# Patient Record
Sex: Female | Born: 1940 | Race: White | Hispanic: No | Marital: Married
Health system: Midwestern US, Academic
[De-identification: ages and names within clinical notes are randomized; demographics above are authoritative.]

---

## 2014-08-14 IMAGING — MG MAMMOGRAM, DIGITAL SCREEN BILA
4 series · 8 of 8 positions shown · non-contrast
Comparison: [DATE] [DATE], [DATE], [DATE] [DATE], [DATE].

MAMMOGRAM BILATERAL
INDICATION: Screening
Benign right biopsy times two.
TECHNIQUE: Four standard projections with CAD.

[Series 1: R CC · right · 2 of 2 slices shown]
[im 1/2]
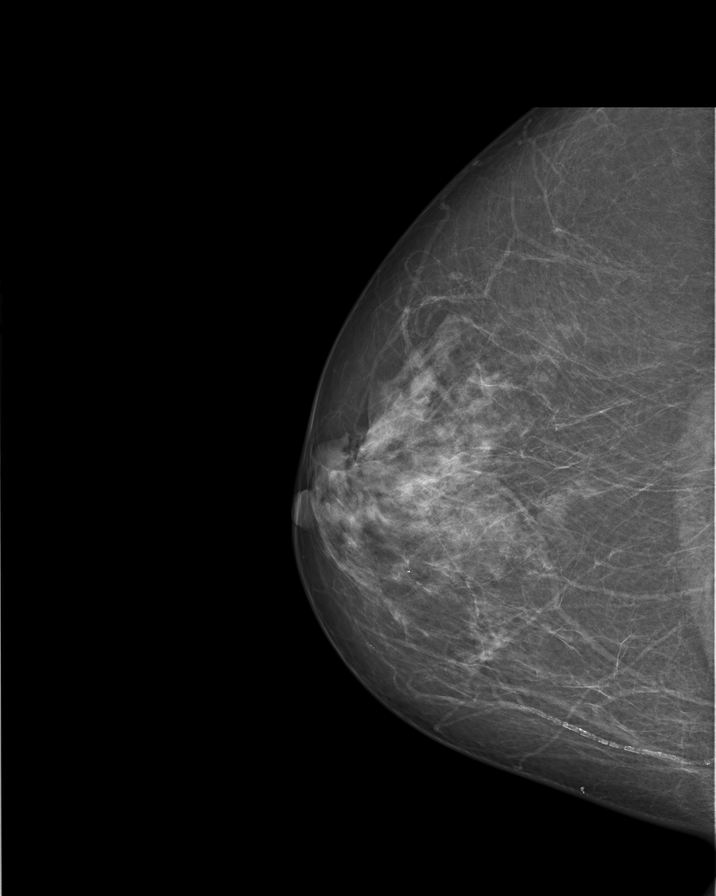
[im 2/2]
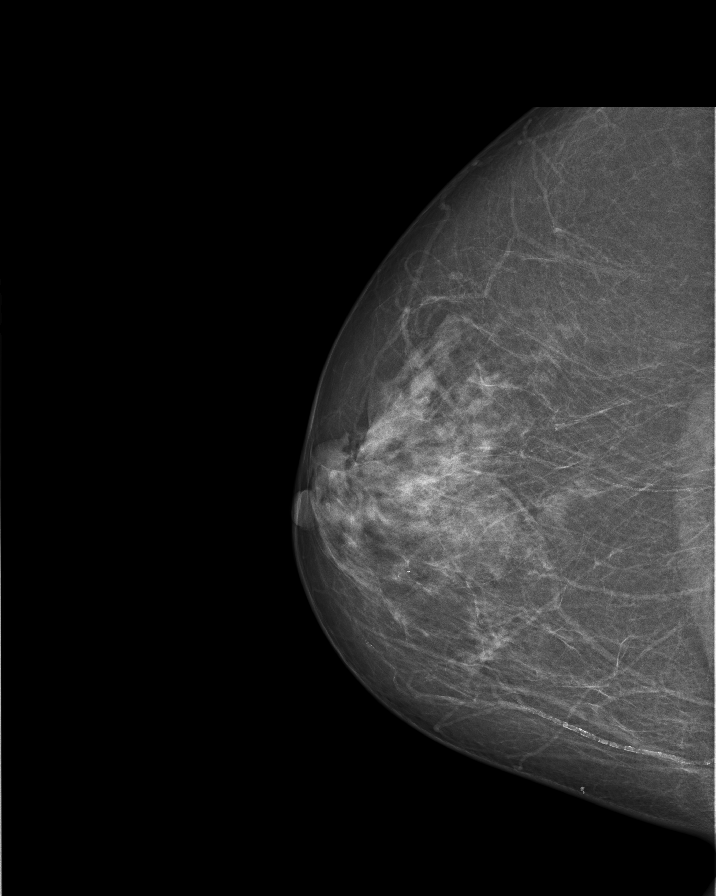

[Series 3: R MLO · right · 2 of 2 slices shown]
[im 1/2]
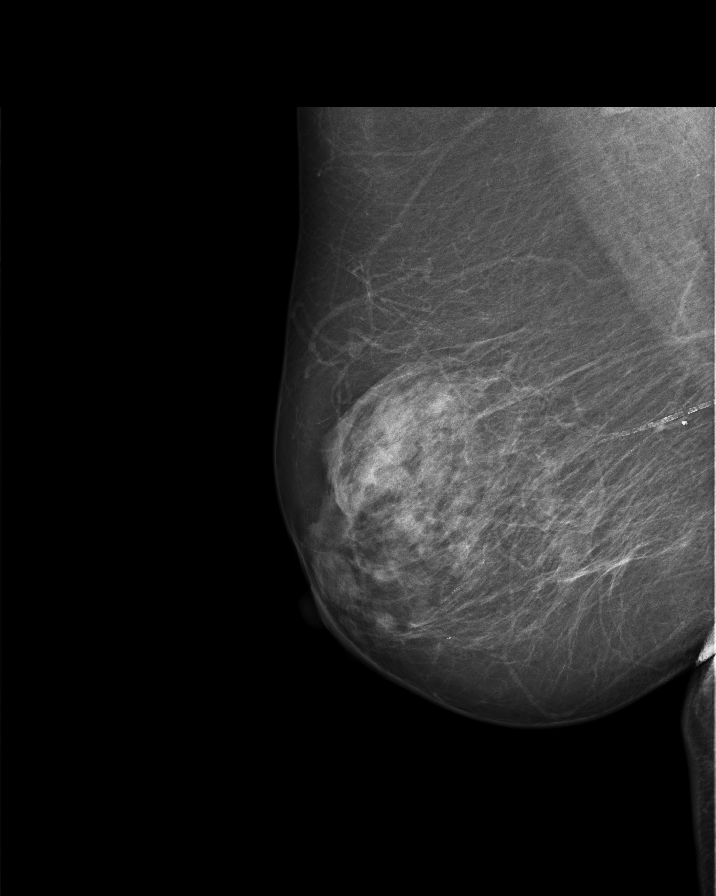
[im 2/2]
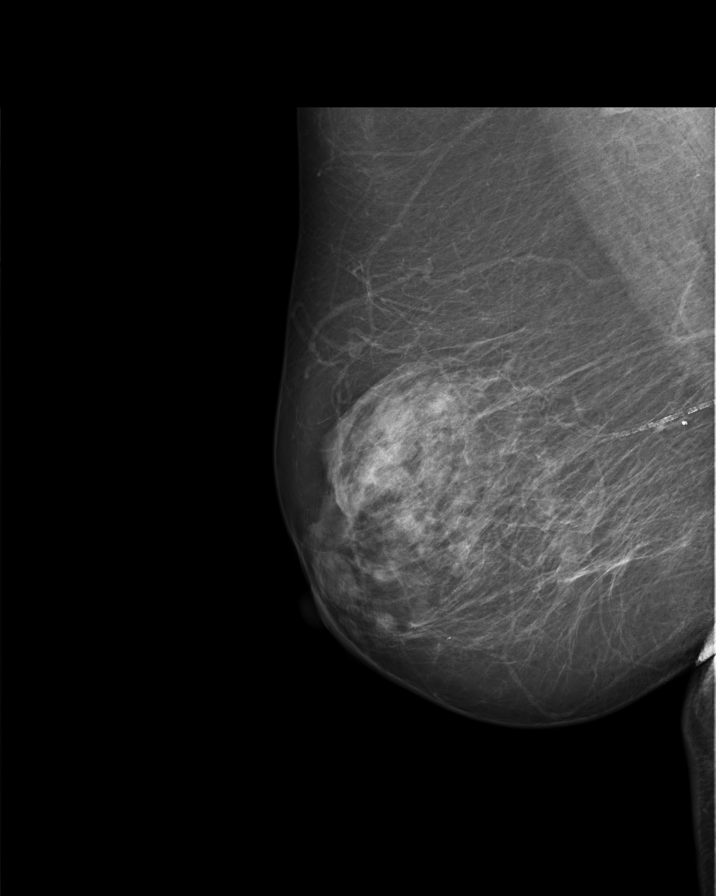

[Series 4: L MLO · left · 2 of 2 slices shown]
[im 1/2]
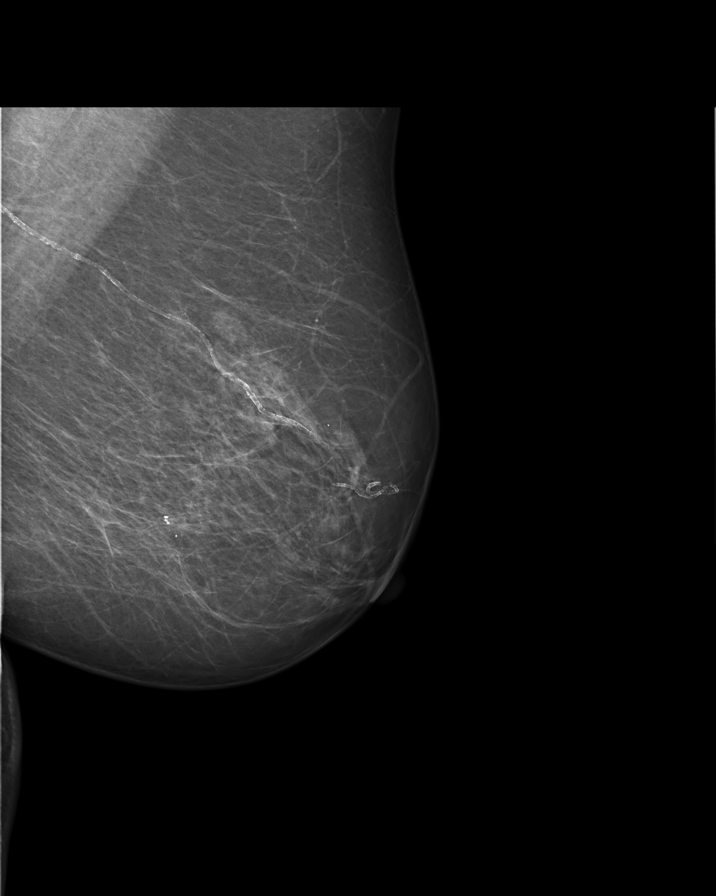
[im 2/2]
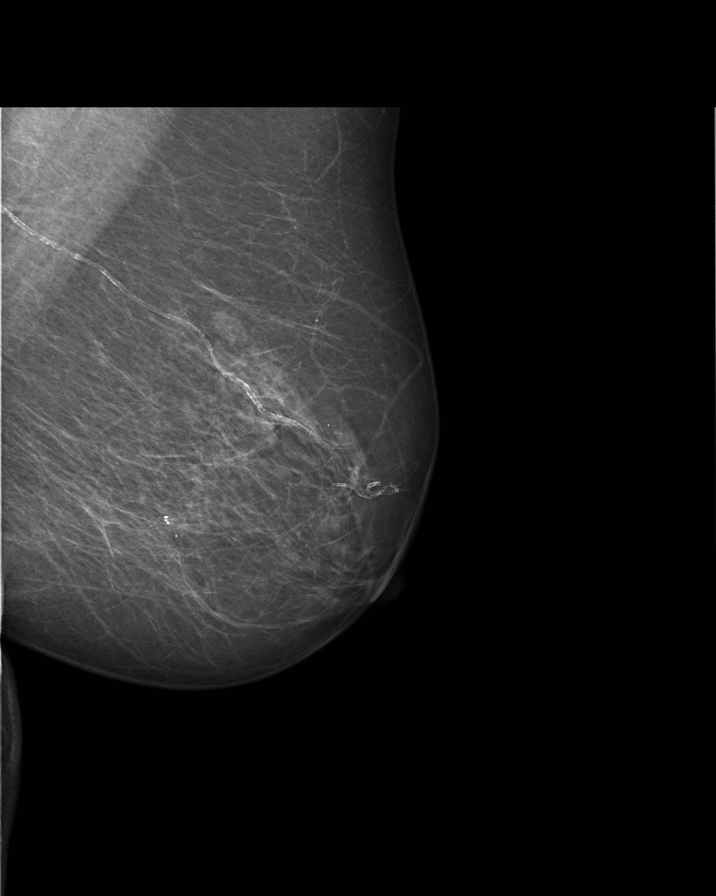

[Series 5: L CC · left · 2 of 2 slices shown]
[im 1/2]
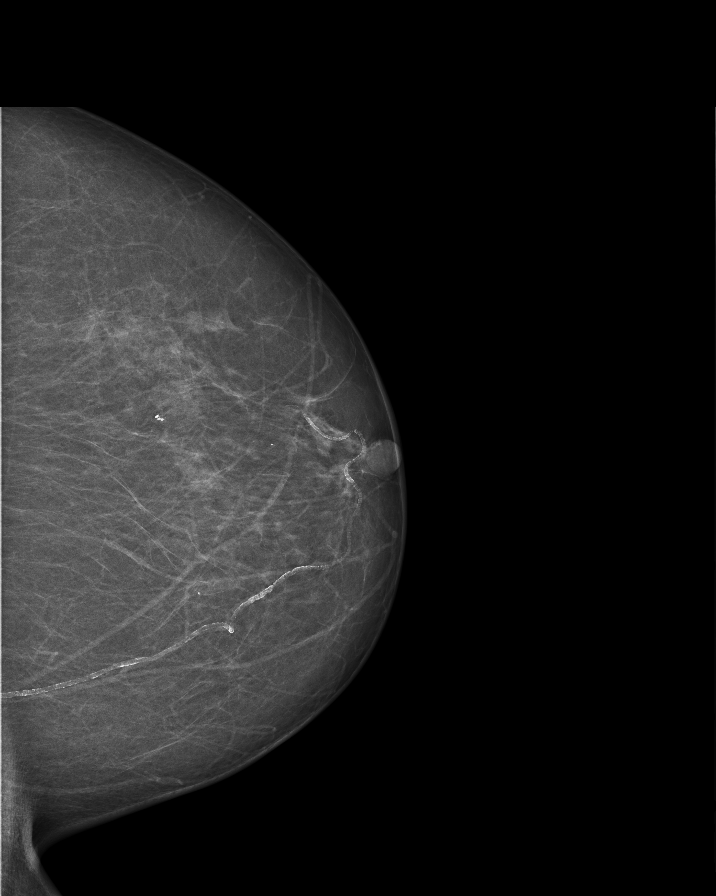
[im 2/2]
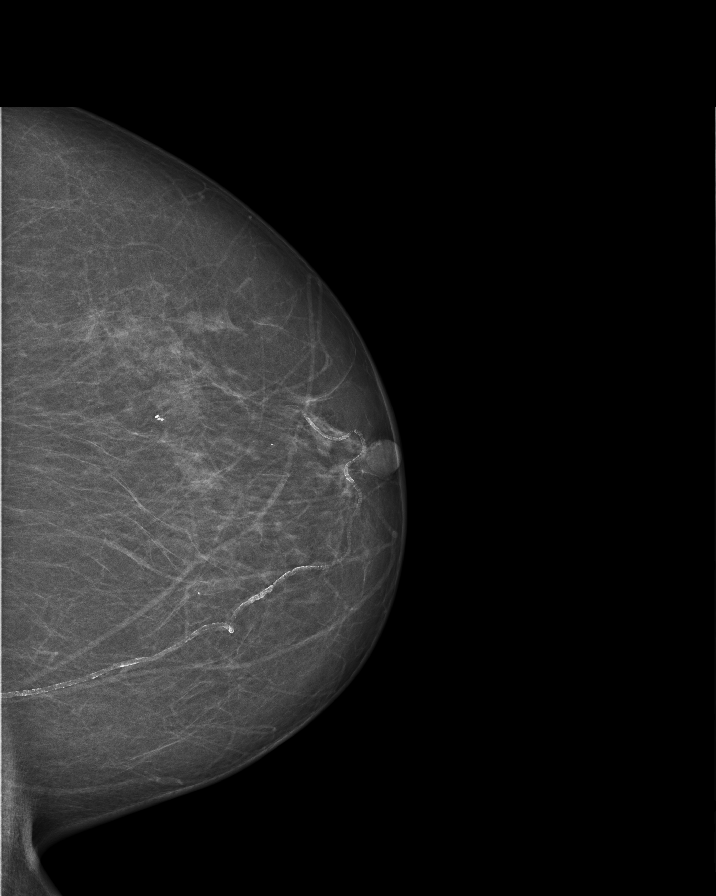

[8 of 8 positions shown; findings below may reference images not displayed]

IMPRESSION: Benign mammogram
BIRAD two
Annual mammographic follow up is recommended.
The absence of a significant mammographic finding should not delay biopsy of
any clinically suspicious lesion
FINDINGS: The breast parenchyma is largely fat replaced.
There are scattered involutional and vascular calcifications.
There is no new, dominant, nor suspicious parenchymal asymmetry, architecture
distortion or calcification

Xray dose in mGy
Right CC
Right MLO
Left  CC
Left. MLO
IMPRESSION: Left CC
Left MLO 0.8, 83

BI-RADS:  2 - BENIGN FINDING

FOLLOWUP:  12 MONTH FOLLOW-UP

Tech Notes: HX.:  2 BENIGN BX - RT BREAST IN EARLY 2000s.  SCREENING.  AB
([id number])

## 2015-07-21 IMAGING — CR PELVIS
2 series · 2 of 2 positions shown · non-contrast
Comparison: none

[hip ap]
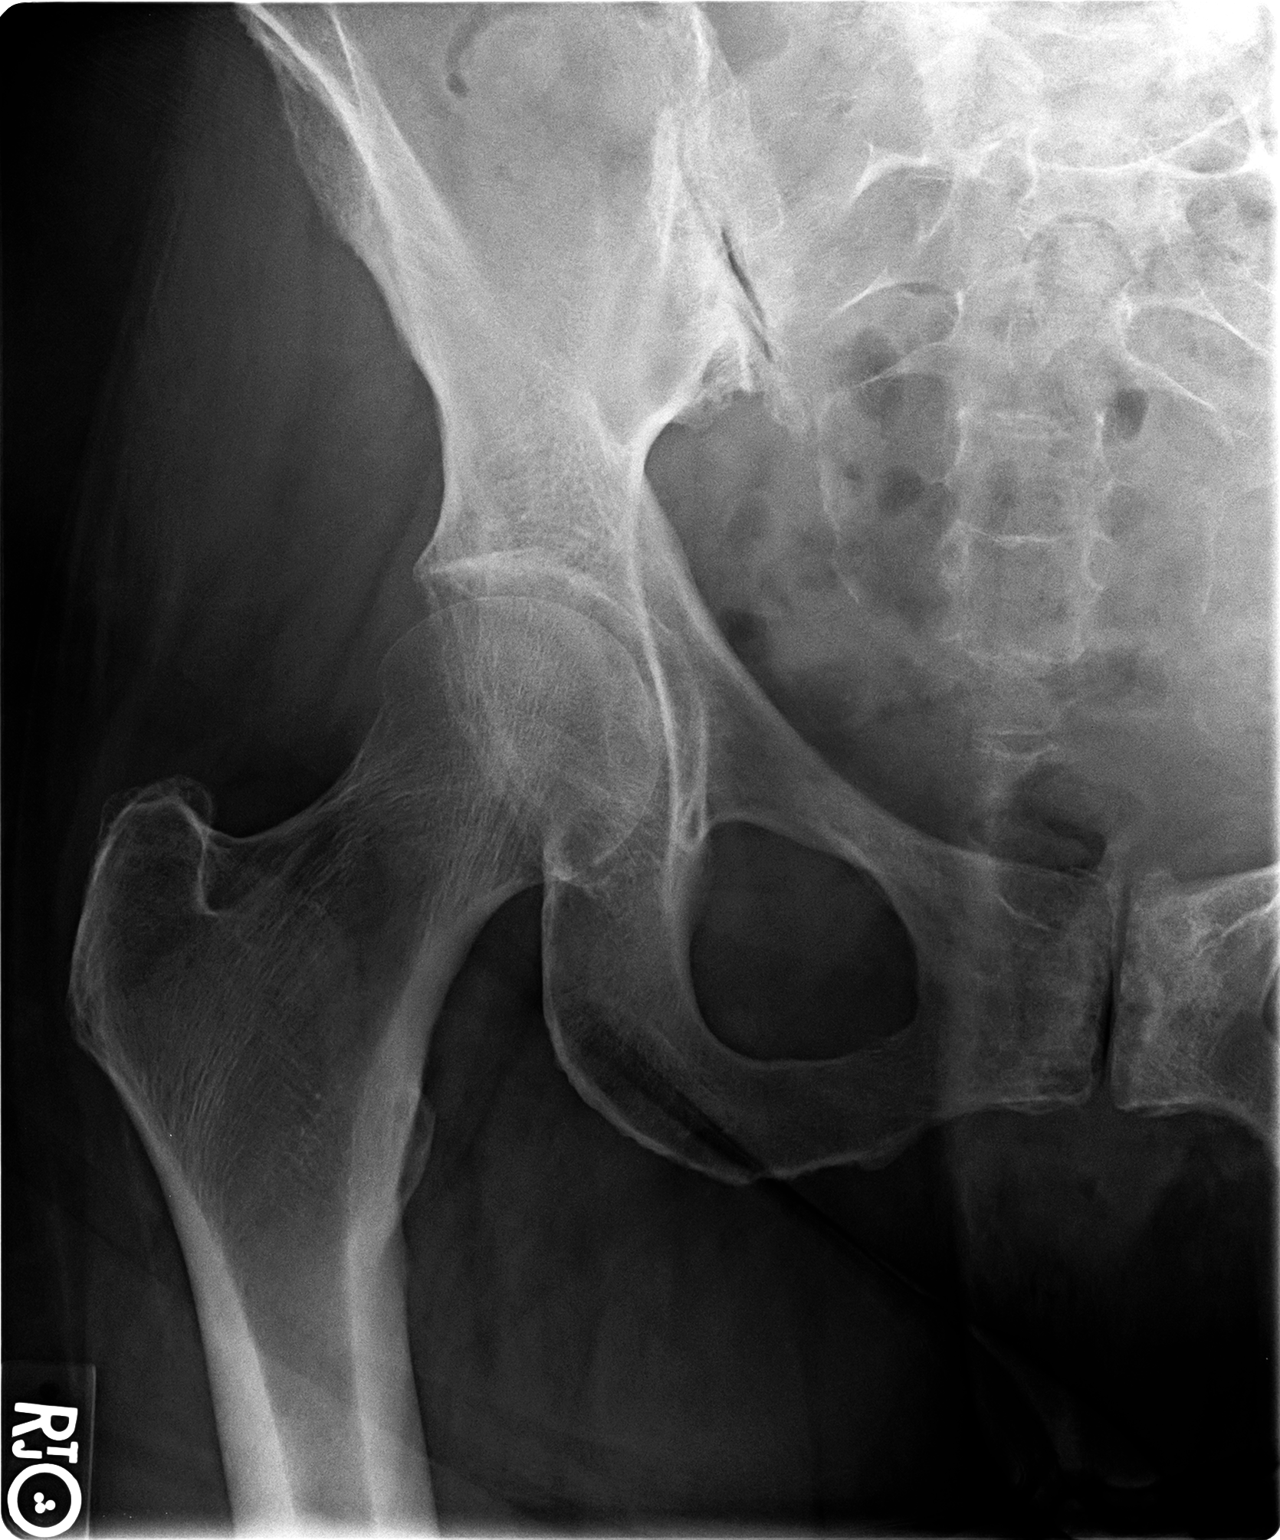

[hip frog]
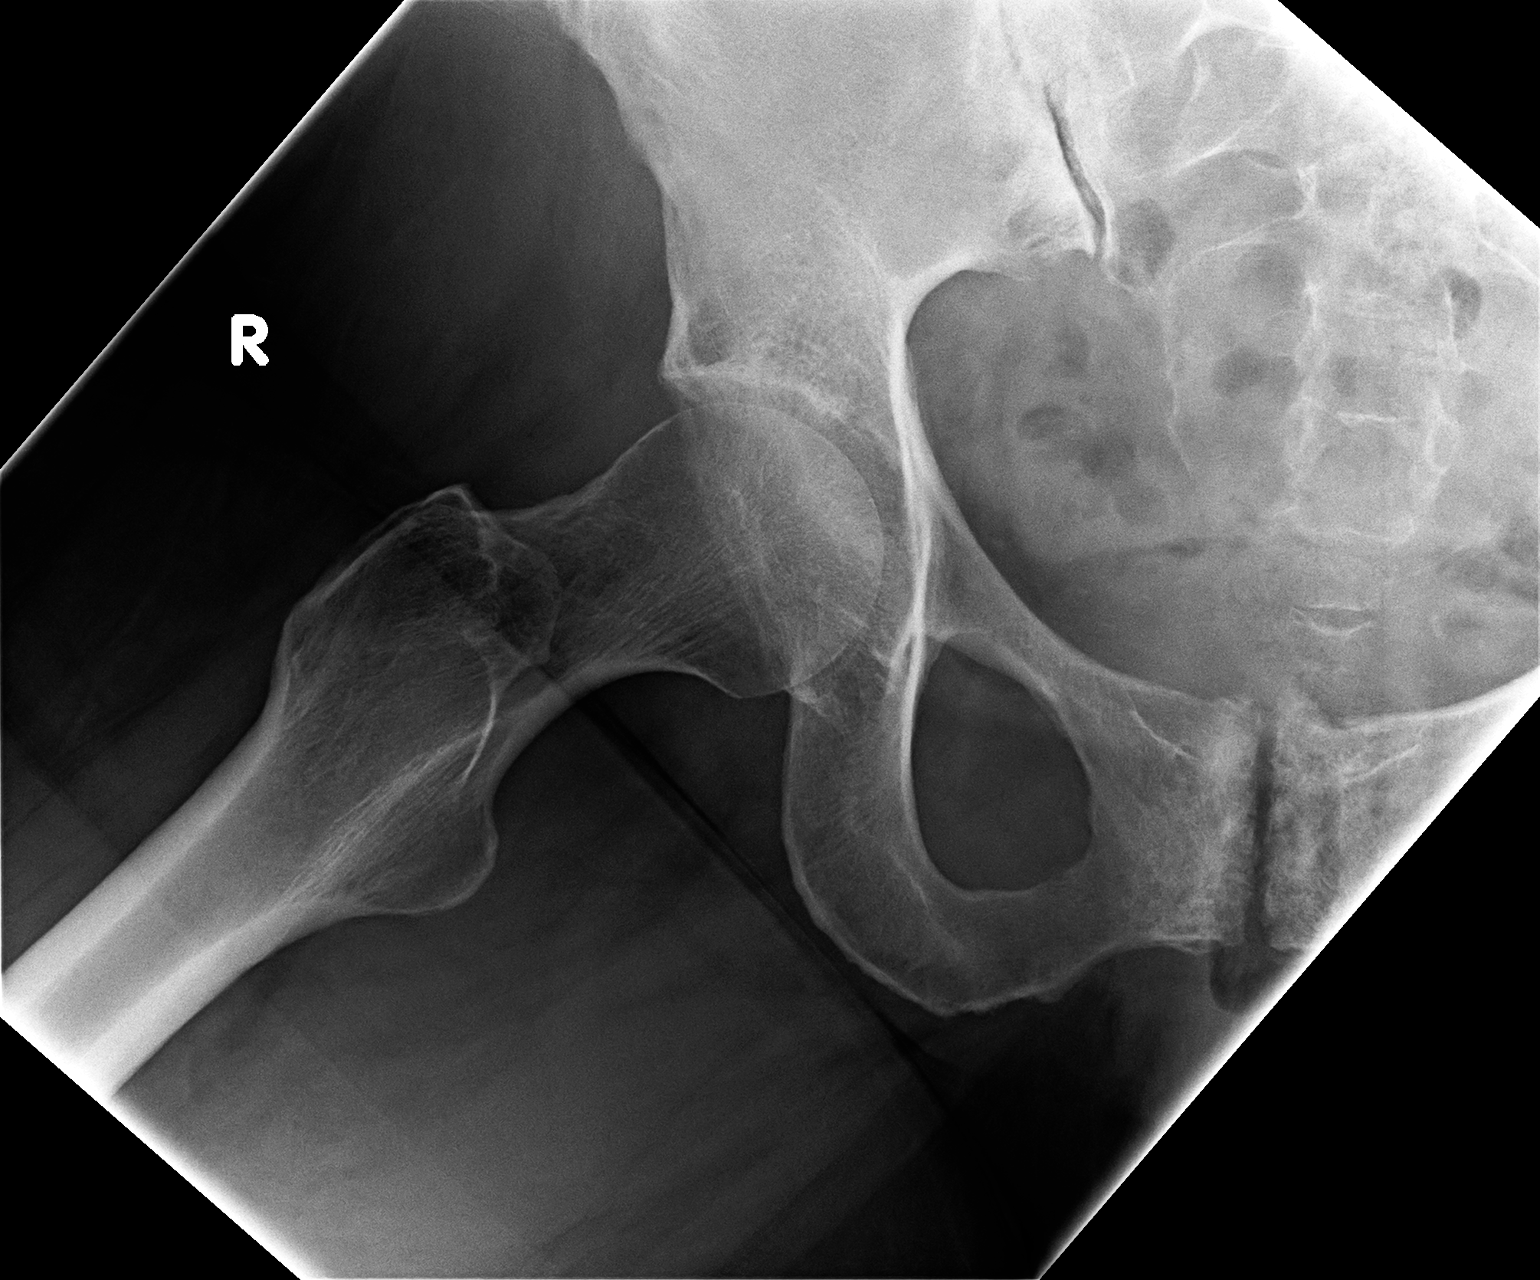

[2 of 2 positions shown; findings below may reference images not displayed]

EXAM
RADIOLOGICAL EXAMINATION, HIP; COMPLETE 2 VIEWS CPT 45759

INDICATION
right hip pain
PT C/O RIGHT HIP PAIN. NO KNOWN INJURY. TJ

TECHNIQUE
2 views of the right hip were acquired.

COMPARISONS
None

FINDINGS
There are no fractures or subluxations. There are no abnormal masses or calcifications. There are
no blastic or lytic lesions.

IMPRESSION
Negative hip.

## 2015-12-27 IMAGING — CR PELVIS
3 series · 3 of 3 positions shown · non-contrast
Comparison: none

PROCEDURE: RT HIP
HISTORY: ACUTE ON CHRONIC RT HIP PAIN. ATRAUMATIC. HX OF ARTHRITIS PREV 09/18/15

[hip ap]
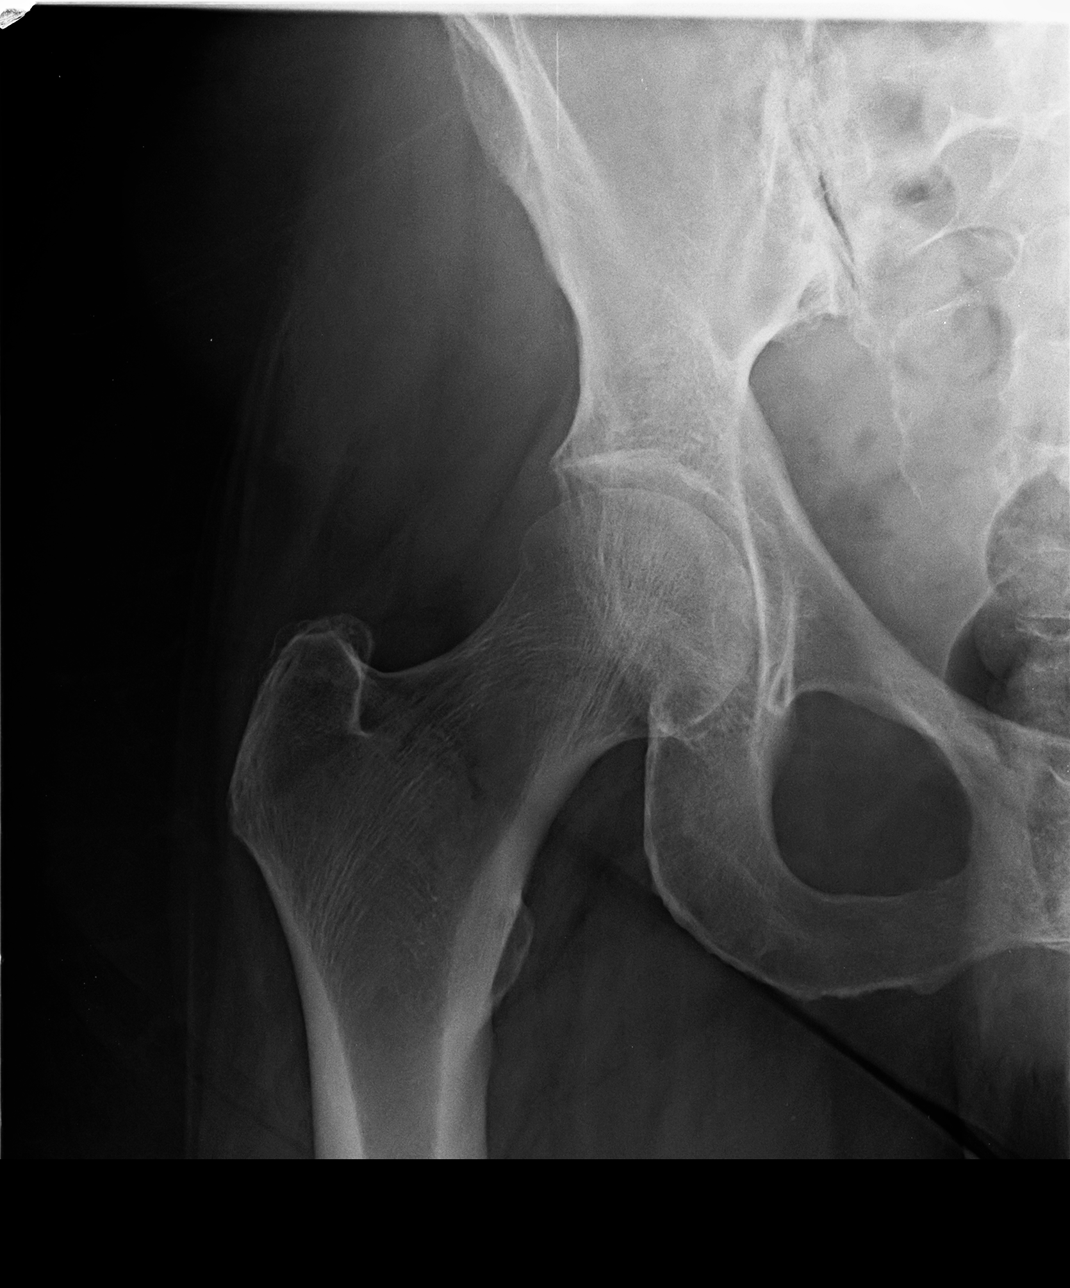

[hip frog]
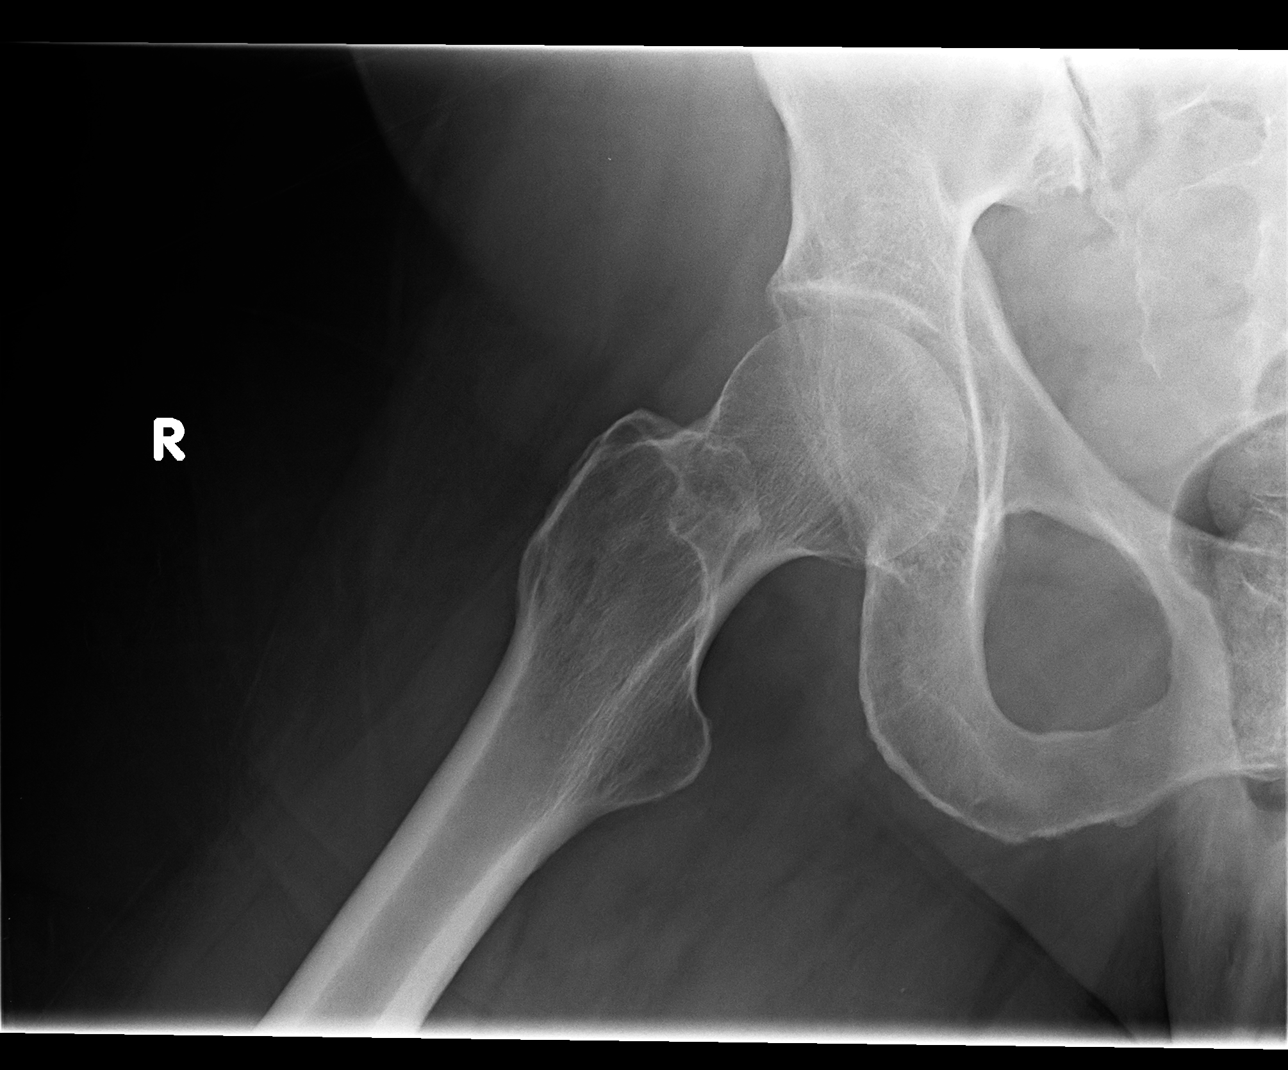

[pelvis]
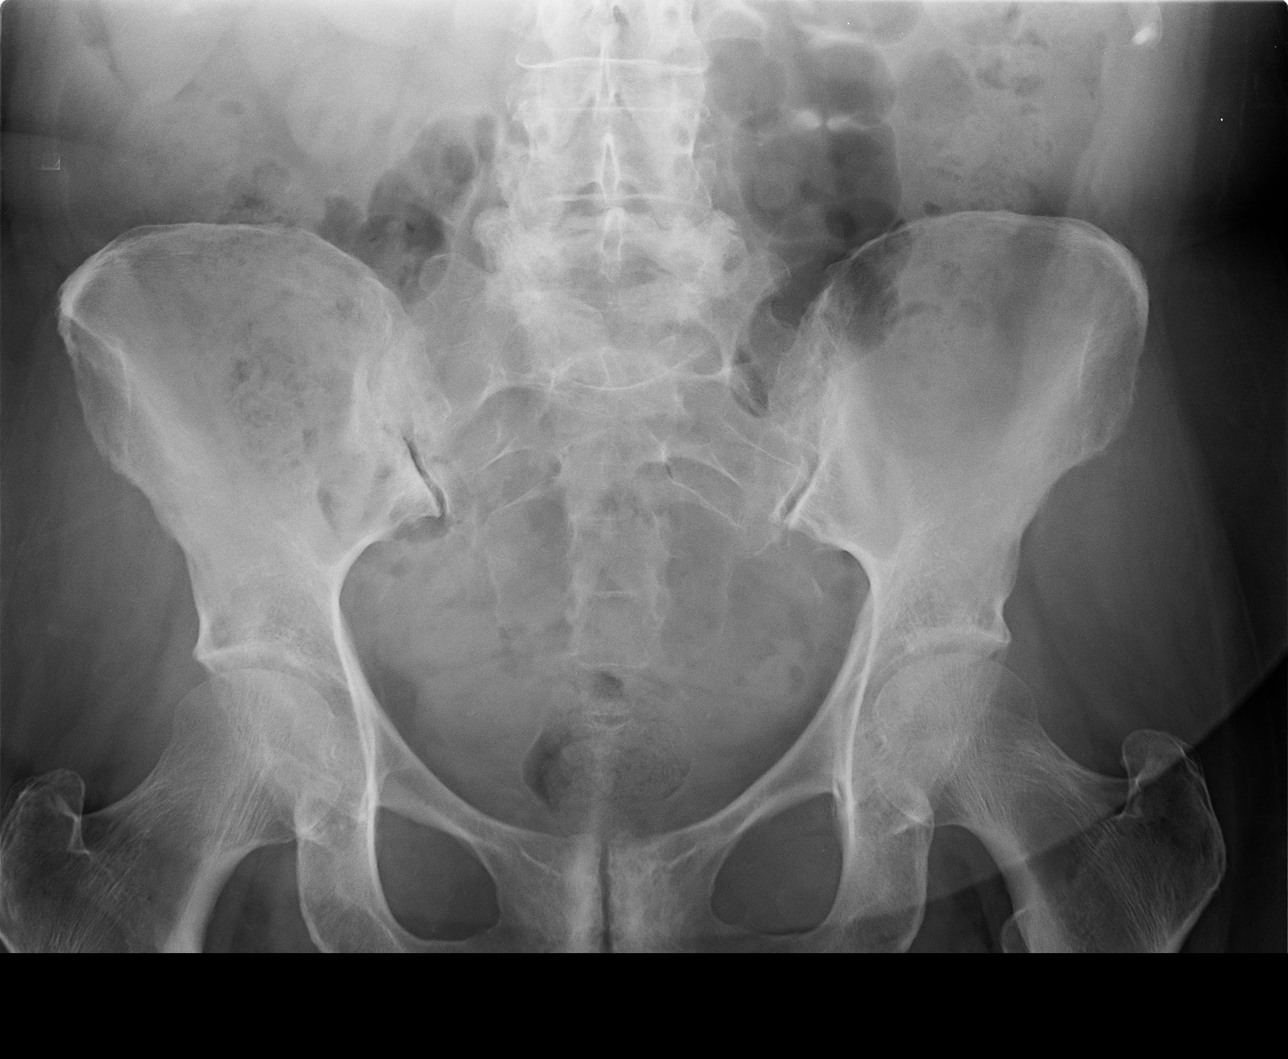

[3 of 3 positions shown; findings below may reference images not displayed]

FINDINGS: Single view of the pelvis and 2 views of the right hip without comparison show no acute
fracture, dislocation, or soft tissue abnormality. Bilateral femoral heads are normally located in
the
osseous acetabulum. Joint space narrowing is seen with osteophyte formation. No plain film evidence
for osteonecrosis is seen.
IMPRESSION: 1. Mild grade 2 Kellgren and Lawrence degenerative changes of the hips bilaterally.
2. No acute fracture or dislocation seen of the pelvis or right hip.

## 2016-09-01 IMAGING — CR LOW_EXM
3 series · 3 of 3 positions shown · non-contrast
Comparison: none

[knee ap]
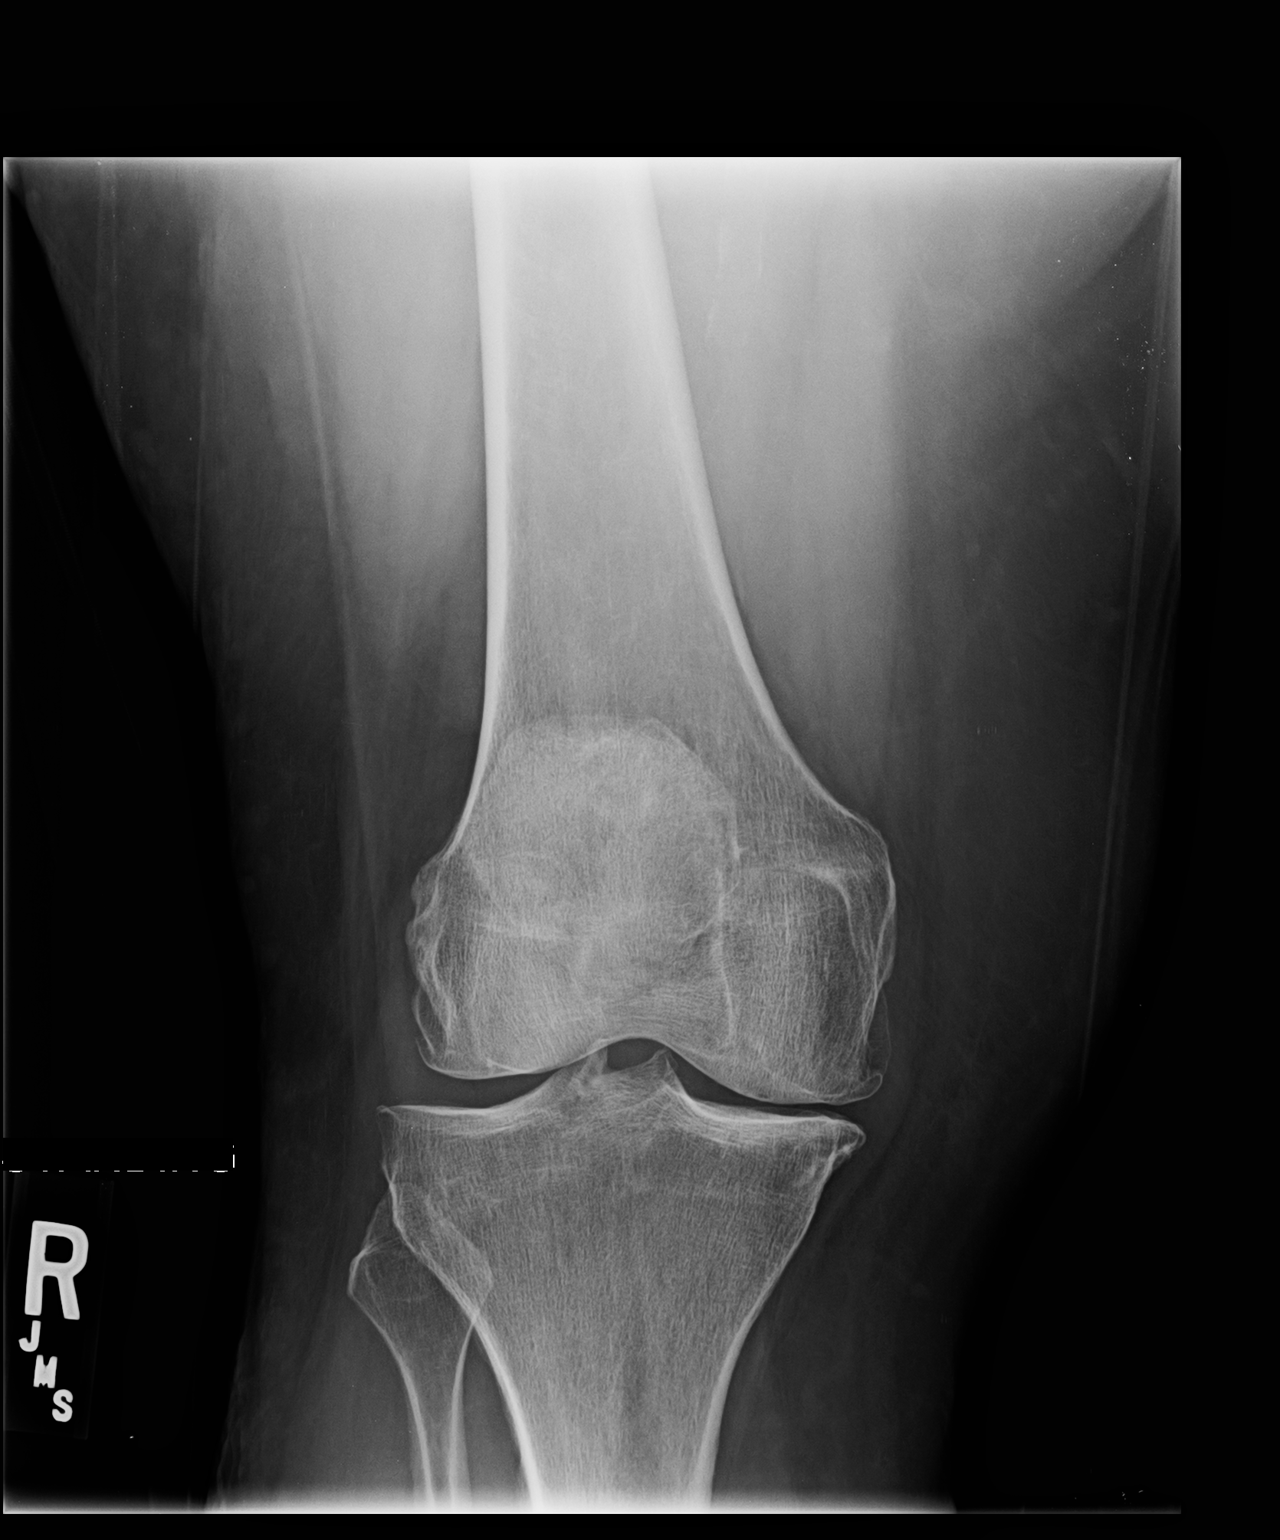

[knee lat]
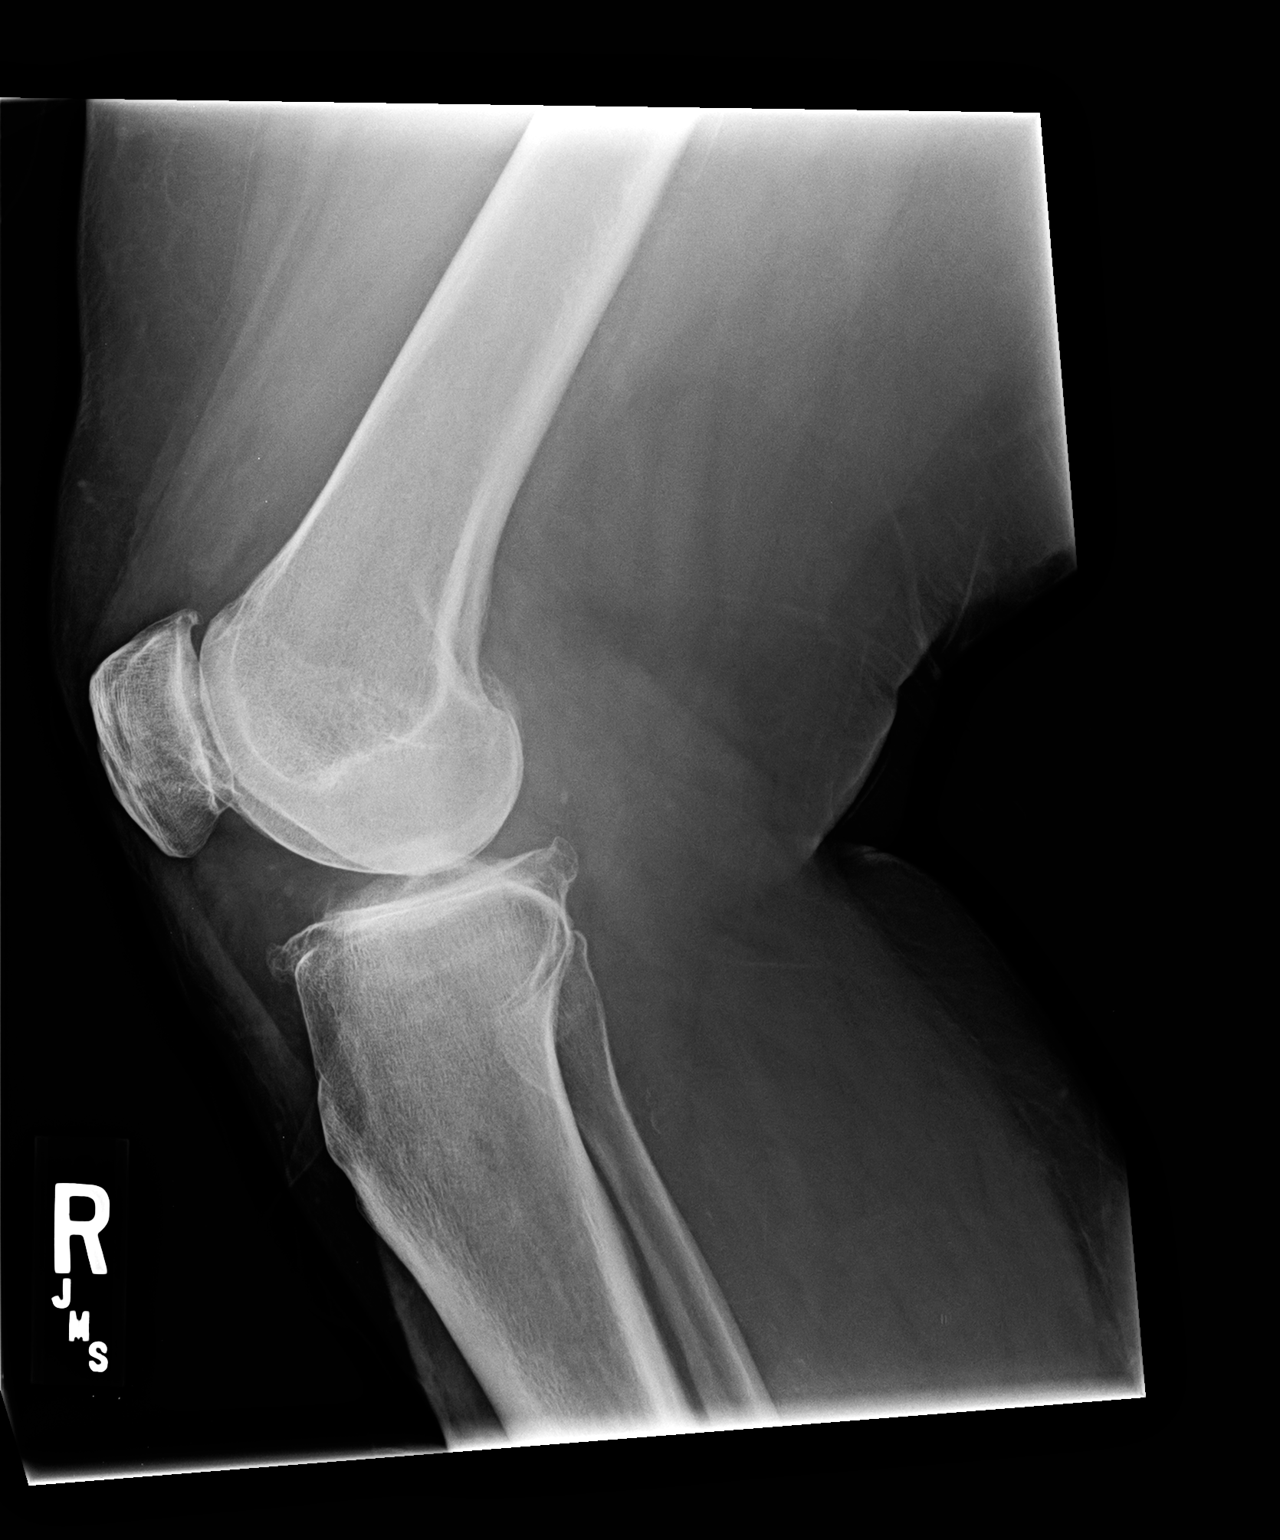

[knee sunrise]
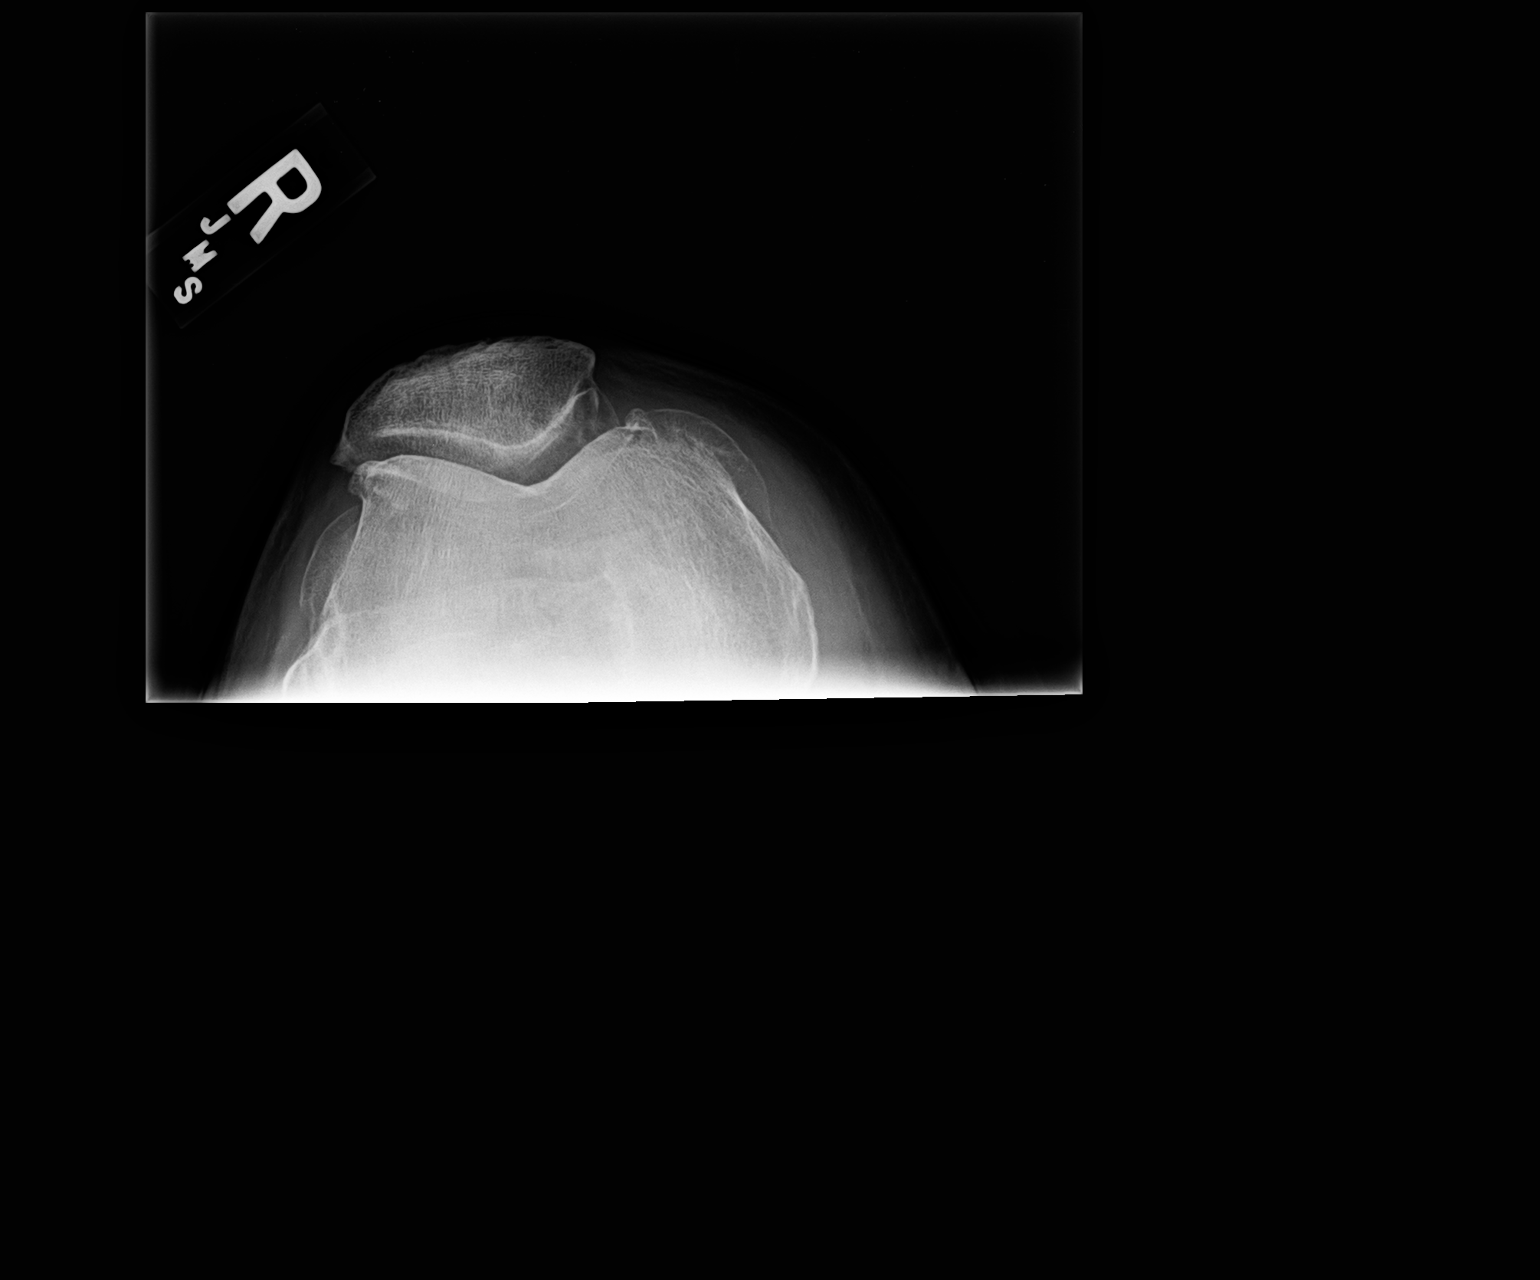

[3 of 3 positions shown; findings below may reference images not displayed]

DIAGNOSTIC STUDIES

EXAM
Three view right knee

INDICATION
RIGHT KNEE PAIN
CHRONIC RIGHT KNEE PAIN. HX OF LEFT KNEE REPLACEMENT X3 YEARS AGO

TECHNIQUE
Frontal, lateral, and sunrise views

COMPARISONS
None

FINDINGS
There is medial compartment joint space loss and tricompartmental osteophyte formation. No
fracture or malalignment. No joint effusion. No destructive bone lesion.

IMPRESSION
Moderate tricompartmental osteoarthritis, greatest medial compartment.

## 2016-09-09 IMAGING — CT Lower Extremities^1_CONFORMIS_LOWER_EXT (Adult)
1 of 3 series · 10 of 14 positions shown, 13 images · non-contrast
Comparison: none

[Series 3: knee conform 1.0 bone · axial · 0.49mm/px · z∈[-652,-452]mm · 10 of 483 slices shown, 13 images]
[im 44/483  soft-tissue]
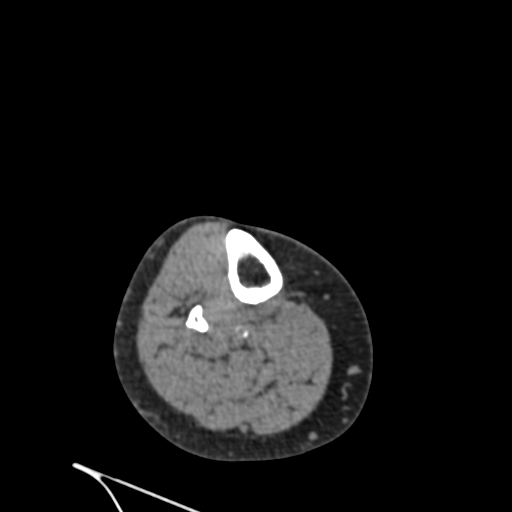
[im 44/483  bone]
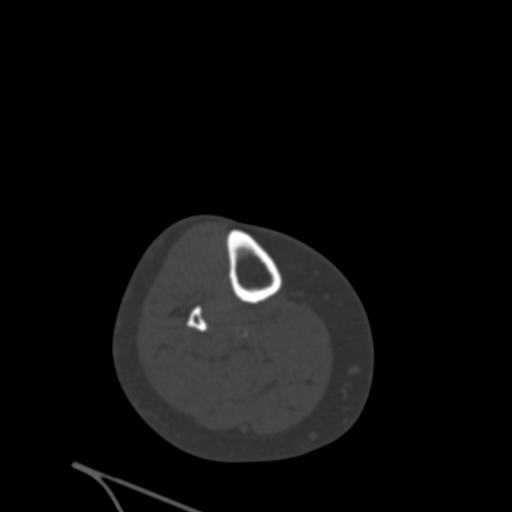
[im 88/483  bone]
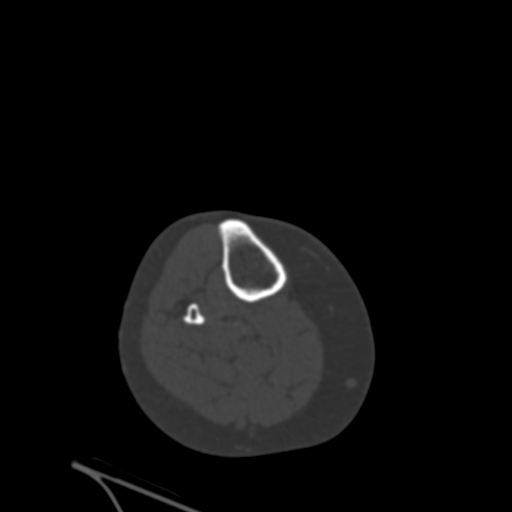
[im 132/483  bone]
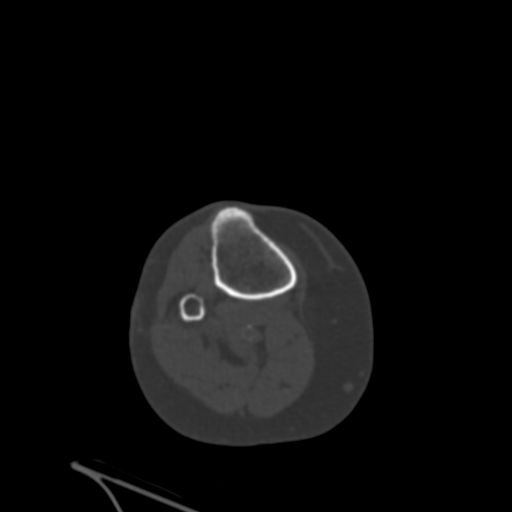
[im 176/483  bone]
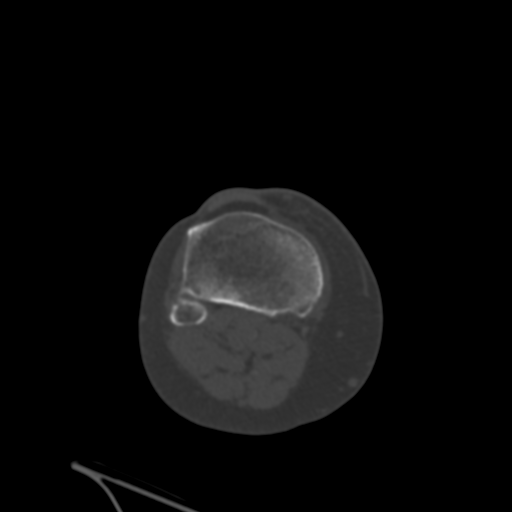
[im 220/483  soft-tissue]
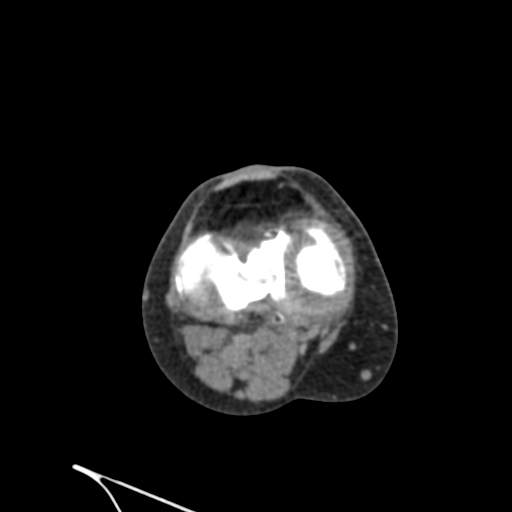
[im 220/483  bone]
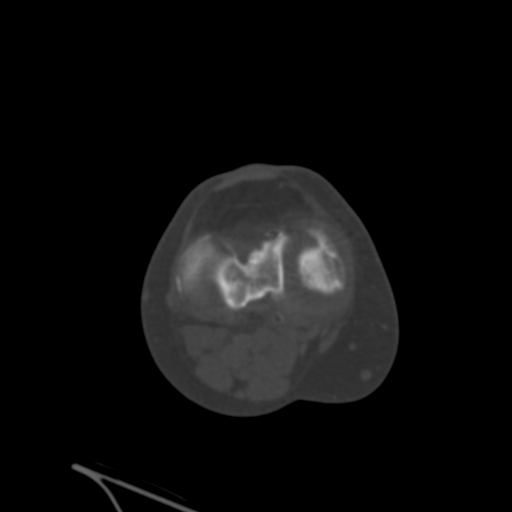
[im 263/483  bone]
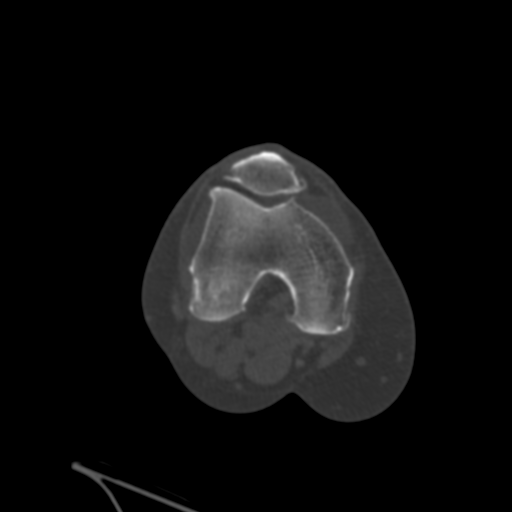
[im 307/483  bone]
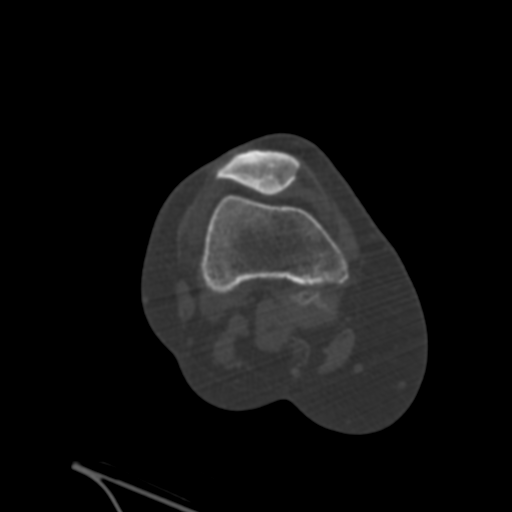
[im 351/483  bone]
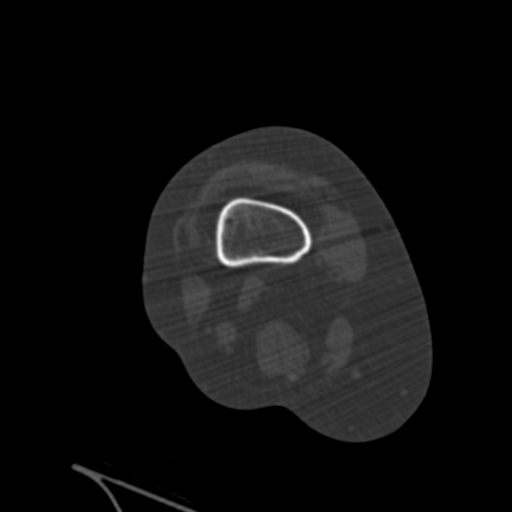
[im 395/483  soft-tissue]
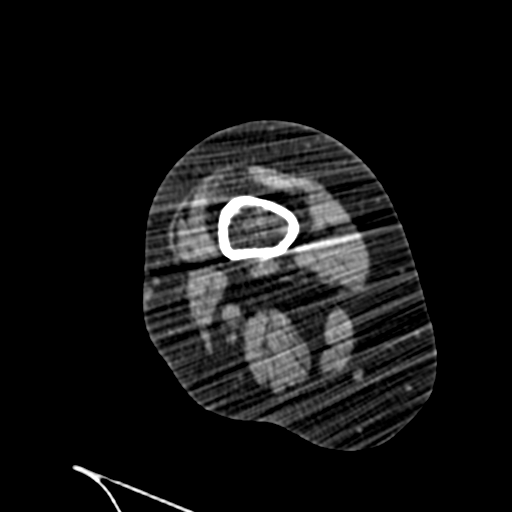
[im 395/483  bone]
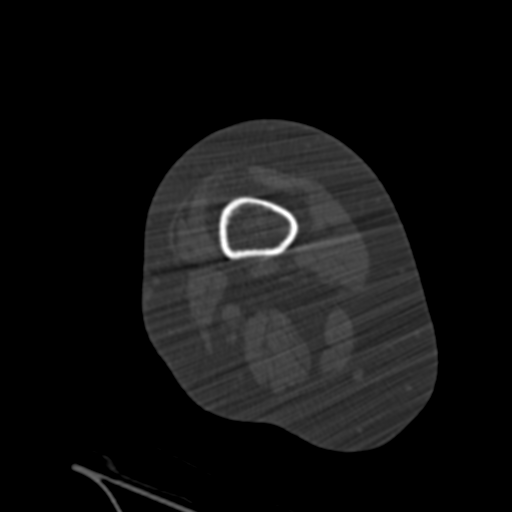
[im 439/483  bone]
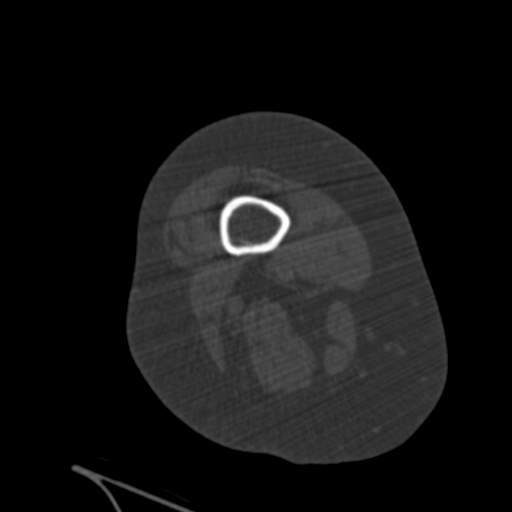

[10 of 14 positions shown; findings below may reference images not displayed]

DIAGNOSTIC STUDIES

EXAM

CT scan of the right knee.

INDICATION

PLANNED KNEE REPLACEMENT
RIGHT TOTAL KNEE REPLACEMENT PLANNED. CONFORMIS PROTOCOL. TJ

TECHNIQUE

All CT scans at this facility use dose modulation, iterative reconstruction, and/or weight based
dosing when appropriate to reduce radiation dose to as low as reasonably achievable.

COMPARISONS

None available

FINDINGS

Exam was performed prior to total knee arthroplasty. Tricompartment degenerative changes of the
knee are evident with narrowing and spurring of the medial lateral compartments as well as
patellofemoral joint space and tibial spines. No fractures are seen.

IMPRESSION

Tricompartment degenerative changes of the right knee.

## 2016-10-19 IMAGING — CR LOW_EXM
2 series · 2 of 2 positions shown · non-contrast
Comparison: none

[knee ap]
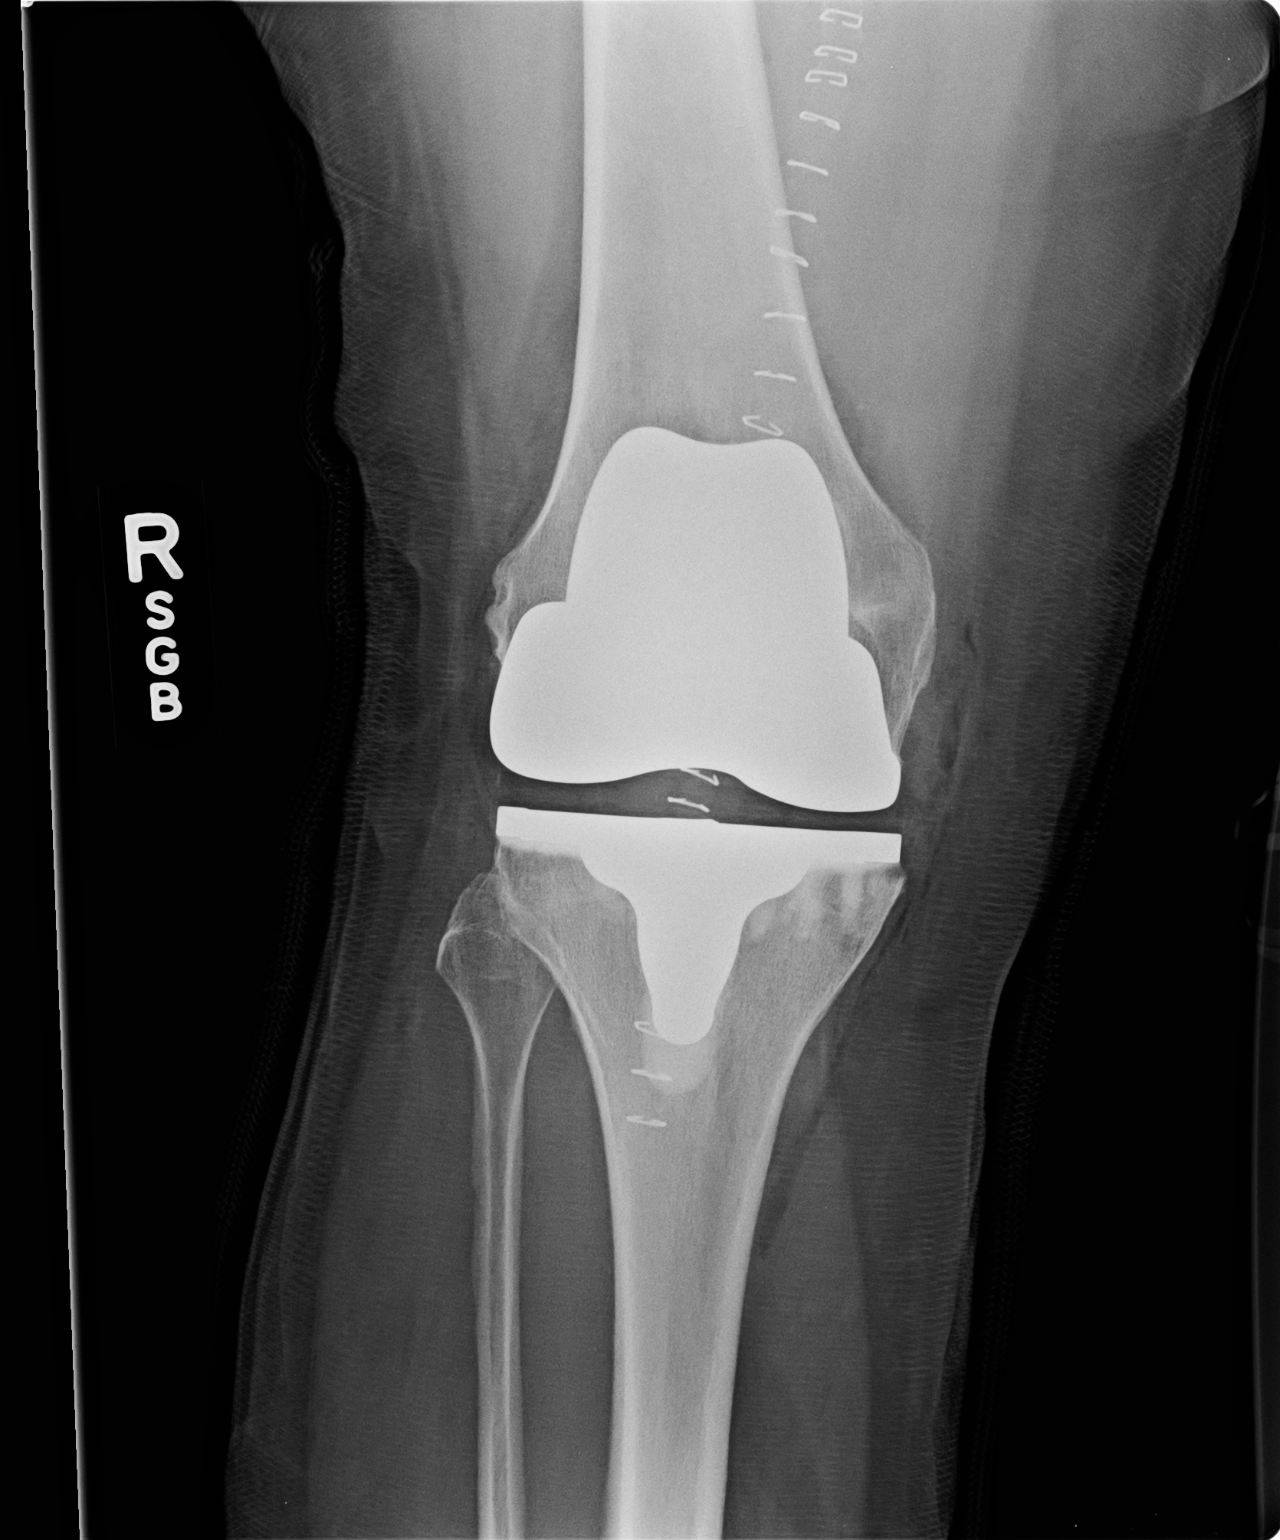

[knee lat]
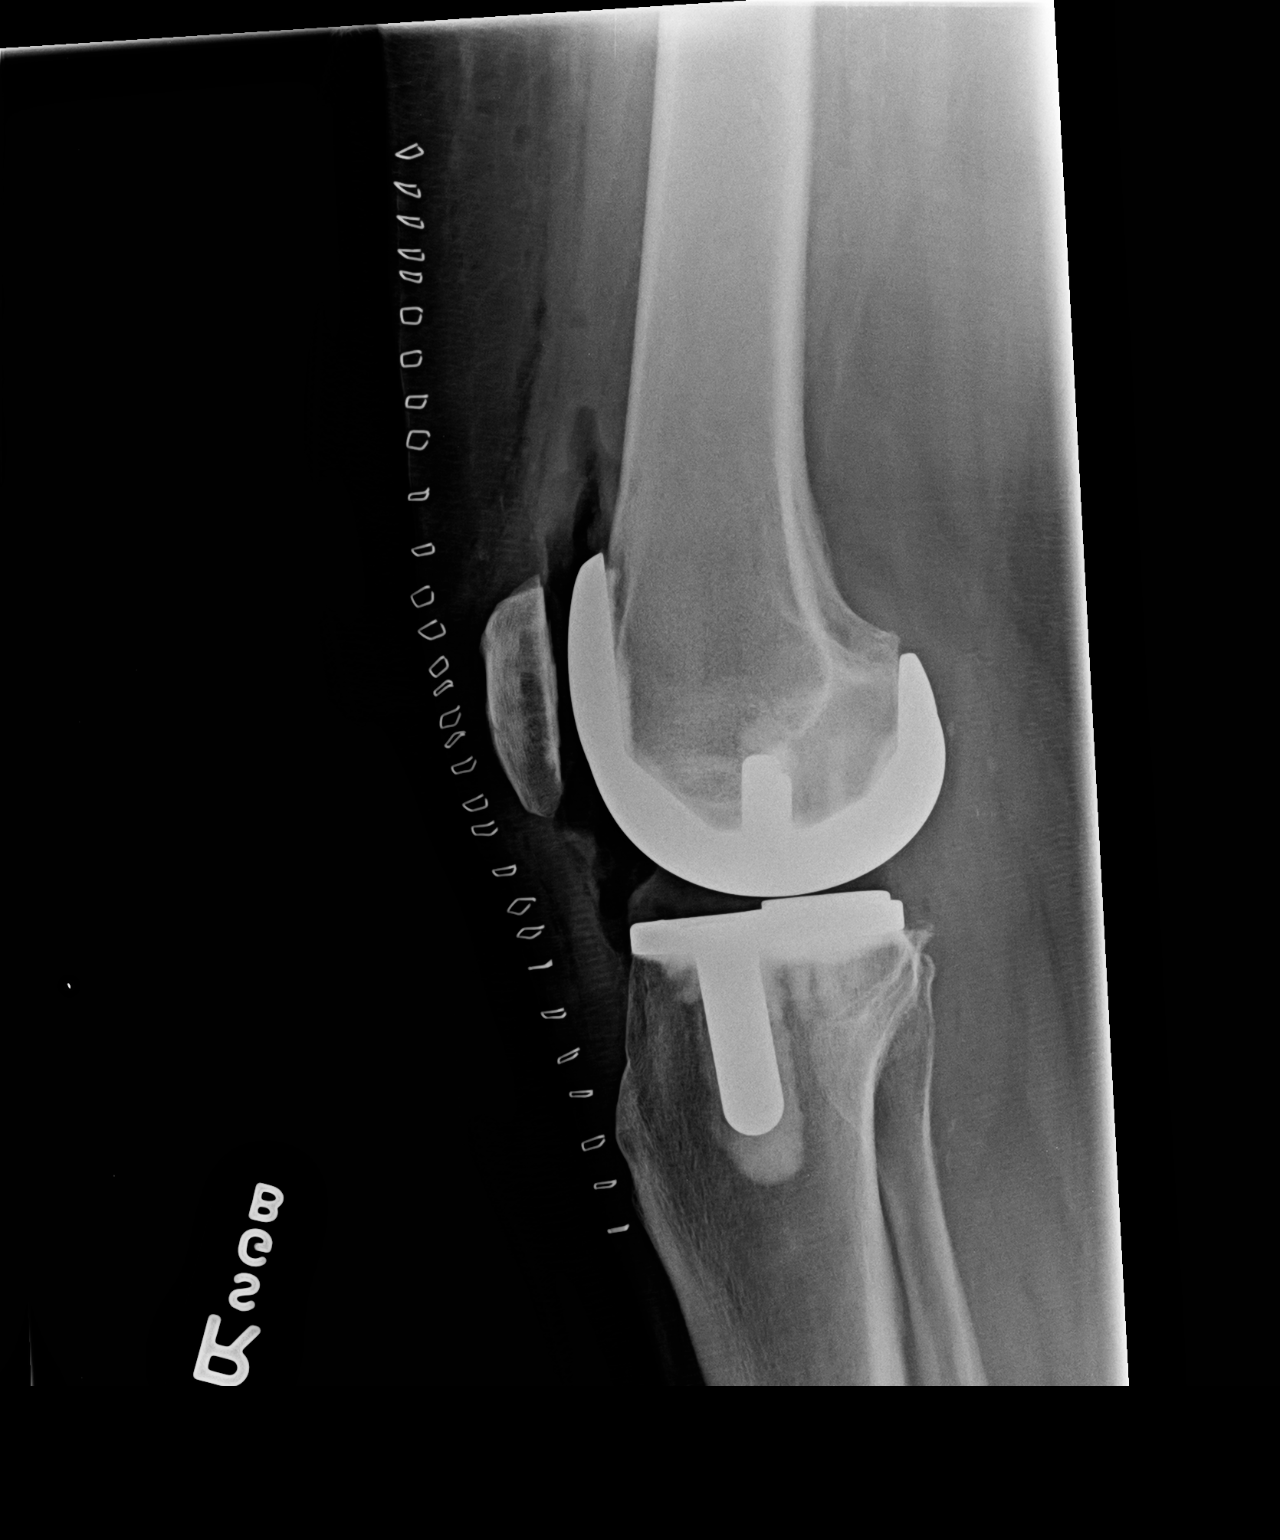

[2 of 2 positions shown; findings below may reference images not displayed]

DIAGNOSTIC STUDIES

EXAM

RIGHT KNEE

INDICATION

S/P Replacement of Knee; osteoarthrosis
AB/SB

TECHNIQUE

AP and cross-table lateral views right knee

COMPARISONS

09/01/2016

FINDINGS

Right knee prosthesis is in place. No findings to suggest fracture or complication are identified.
Air-fluid level is present in the knee joint. Subcutaneous air and skin staples are present.
Bandage artifact is noted

IMPRESSION

Postop changes compatible with right knee prosthesis.

## 2017-03-01 ENCOUNTER — Encounter: Admit: 2017-03-01 | Discharge: 2017-03-01 | Payer: MEDICARE

## 2017-03-10 ENCOUNTER — Encounter: Admit: 2017-03-10 | Discharge: 2017-03-10 | Payer: MEDICARE

## 2017-03-10 ENCOUNTER — Ambulatory Visit: Admit: 2017-03-10 | Discharge: 2017-03-11 | Payer: MEDICARE

## 2017-03-10 DIAGNOSIS — R0789 Other chest pain: ICD-10-CM

## 2017-03-10 DIAGNOSIS — I4821 Permanent atrial fibrillation: ICD-10-CM

## 2017-03-10 DIAGNOSIS — I10 Essential (primary) hypertension: ICD-10-CM

## 2017-03-10 DIAGNOSIS — I4891 Unspecified atrial fibrillation: Principal | ICD-10-CM

## 2017-03-10 DIAGNOSIS — E785 Hyperlipidemia, unspecified: ICD-10-CM

## 2017-03-10 DIAGNOSIS — R079 Chest pain, unspecified: ICD-10-CM

## 2017-03-10 NOTE — Assessment & Plan Note
She has been in atrial fibrillation for over 4 years and I would consider it permanent.  She will need to maintain oral anticoagulation but if for some reason we need to discontinue it long-term she might be a candidate for a left atrial appendage occlusion device.  She should do fine if she needs to hold anticoagulation short-term for procedures, however.

## 2017-03-10 NOTE — Assessment & Plan Note
Her blood pressure seems okay on the current medical program.

## 2017-03-10 NOTE — Assessment & Plan Note
Chest discomfort is not entirely typical for angina but it is in a location that makes me wonder about the possibility of cardiac ischemia.  Her last stress test was about 3 years ago.  I have ordered a regadenoson thallium to evaluate her symptoms.

## 2017-03-10 NOTE — Assessment & Plan Note
Lab Results   Component Value Date    CHOL 185 09/10/2016    TRIG 121 09/10/2016    HDL 61 09/10/2016    LDL 102 (H) 09/10/2016    VLDL 15 12/23/2012    NONHDLCHOL 124 09/10/2016    CHOLHDLC 3.0 09/10/2016

## 2017-03-10 NOTE — Progress Notes
Date of Service: 03/10/2017    Karen Horton is a 76 y.o. female.       HPI     Karen Horton was in the [place] office today for follow-up regarding atrial fibrillation.    She feels a little fluttering when she first lays in bed at night.  She has not had any problems with bleeding or thromboembolic symptoms.    She gets occasional retrosternal pain symptoms intermittently for the past 6-8 months.  Lasts 10-15 minutes and lets up if she tries to relax.  She sometimes gets this feeling when she first wakes up in the morning.             Vitals:    03/10/17 1426 03/10/17 1438   BP: 140/90 144/86   Pulse: 89    Weight: 97.4 kg (214 lb 12.8 oz)    Height: 1.651 m (5' 5)      Body mass index is 35.74 kg/m???.     Past Medical History  Patient Active Problem List    Diagnosis Date Noted   ??? Permanent atrial fibrillation (HCC) 04/17/2014     2014 - Stroke--no known AF at that time  2015 - Presented with asymptomatic AF.  Eliquis started.  Chronic, permanent AF.  2016 - Diltiazem started for rate control     ??? 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  No known etiology at that time.     ??? Left-sided weakness 12/22/2012   ??? HLD (hyperlipidemia) 09/09/2010   ??? Chest pressure 09/08/2010   ??? Hypertension 09/07/2010     January, 2012 - lisinopril started           Review of Systems   Constitution: Positive for malaise/fatigue and night sweats.   HENT: Positive for hearing loss.    Eyes: Positive for photophobia.   Cardiovascular: Positive for chest pain, dyspnea on exertion and irregular heartbeat.   Respiratory: Positive for shortness of breath.    Endocrine: Positive for polyuria.   Hematologic/Lymphatic: Negative.    Skin: Negative.    Musculoskeletal: Positive for arthritis, back pain, gout, joint pain and stiffness.   Gastrointestinal: Negative.    Genitourinary: Positive for frequency, incomplete emptying and nocturia.   Neurological: Positive for excessive daytime sleepiness, focal weakness and headaches.   Psychiatric/Behavioral: Negative.    Allergic/Immunologic: Negative.        Physical Exam    Physical Exam   General Appearance: no distress   Skin: warm, no ulcers or xanthomas   Digits and Nails: no cyanosis or clubbing   Eyes: conjunctivae and lids normal, pupils are equal and round   Teeth/Gums/Palate: dentition unremarkable, no lesions   Lips & Oral Mucosa: no pallor or cyanosis   Neck Veins: normal JVP , neck veins are not distended   Thyroid: no nodules, masses, tenderness or enlargement   Chest Inspection: chest is normal in appearance   Respiratory Effort: breathing comfortably, no respiratory distress   Auscultation/Percussion: lungs clear to auscultation, no rales or rhonchi, no wheezing   PMI: PMI not enlarged or displaced   Cardiac Rhythm: irregular rhythm and normal rate   Cardiac Auscultation: S1, S2 normal, no rub, no gallop   Murmurs: no murmur   Peripheral Circulation: normal peripheral circulation   Carotid Arteries: normal carotid upstroke bilaterally, no bruits   Radial Arteries: normal symmetric radial pulses   Abdominal Aorta: no abdominal aortic bruit   Pedal Pulses: normal symmetric pedal  pulses   Lower Extremity Edema: no lower extremity edema   Abdominal Exam: soft, non-tender, no masses, bowel sounds normal   Liver & Spleen: no organomegaly   Gait & Station: walks without assistance   Muscle Strength: normal muscle tone   Orientation: oriented to time, place and person   Affect & Mood: appropriate and sustained affect   Language and Memory: patient responsive and seems to comprehend information   Neurologic Exam: neurological assessment grossly intact   Other: moves all extremities      Cardiovascular Studies    EKG:  Atrial fibrillation, rate 89.  Left anterior fascicular block.  Non-specific T wave changes.    Problems Addressed Today  Encounter Diagnoses   Name Primary?   ??? Atrial fibrillation, unspecified type (HCC) Yes   ??? Chest pain, unspecified type ??? Permanent atrial fibrillation (HCC)    ??? Essential hypertension    ??? Hyperlipidemia, unspecified hyperlipidemia type    ??? Chest pressure        Assessment and Plan       Permanent atrial fibrillation (HCC)  She has been in atrial fibrillation for over 4 years and I would consider it permanent.  She will need to maintain oral anticoagulation but if for some reason we need to discontinue it long-term she might be a candidate for a left atrial appendage occlusion device.  She should do fine if she needs to hold anticoagulation short-term for procedures, however.    Hypertension  Her blood pressure seems okay on the current medical program.    HLD (hyperlipidemia)  Lab Results   Component Value Date    CHOL 185 09/10/2016    TRIG 121 09/10/2016    HDL 61 09/10/2016    LDL 102 (H) 09/10/2016    VLDL 15 12/23/2012    NONHDLCHOL 124 09/10/2016    CHOLHDLC 3.0 09/10/2016          Chest pressure  Chest discomfort is not entirely typical for angina but it is in a location that makes me wonder about the possibility of cardiac ischemia.  Her last stress test was about 3 years ago.  I have ordered a regadenoson thallium to evaluate her symptoms.      Current Medications (including today's revisions)  ??? apixaban (ELIQUIS) 5 mg tab tablet Take 1 Tab by mouth twice daily.   ??? cyanocobalamin (VITAMIN B-12, RUBRAMIN) 1,000 mcg/mL injection Inject 1,000 mcg to area(s) as directed every 30 days.   ??? diltiazem CD (CARDIZEM CD) 120 mg capsule Take 120 mg by mouth daily.   ??? FOLIC ACID/MULTIVIT-MIN/LUTEIN (CENTRUM SILVER PO) Take 1 tablet by mouth daily.   ??? hydroCHLOROthiazide (HYDRODIURIL) 25 mg tablet Take 12.5 mg by mouth every morning.   ??? simvastatin (ZOCOR) 10 mg tablet Take 1 Tab by mouth at bedtime daily.

## 2017-03-16 ENCOUNTER — Ambulatory Visit: Admit: 2017-03-16 | Discharge: 2017-03-17 | Payer: MEDICARE

## 2017-03-16 ENCOUNTER — Encounter: Admit: 2017-03-16 | Discharge: 2017-03-16 | Payer: MEDICARE

## 2017-03-16 DIAGNOSIS — R0789 Other chest pain: ICD-10-CM

## 2017-03-16 DIAGNOSIS — I4891 Unspecified atrial fibrillation: Principal | ICD-10-CM

## 2017-03-16 NOTE — Telephone Encounter
-----   Message from Vanice SarahSteven D Owens, MD sent at 03/16/2017  1:21 PM CDT -----  Jeanell SparrowAtch nursing can you please let her know that this looks OK?  Thanks.    Cc:  Dr. Olene FlossGrowney

## 2017-03-16 NOTE — Telephone Encounter
Called and discussed results with patient.  No questions at this time.  Pt will callback with any questions, concerns or problems.

## 2017-06-30 ENCOUNTER — Encounter: Admit: 2017-06-30 | Discharge: 2017-06-30 | Payer: MEDICARE

## 2017-06-30 ENCOUNTER — Inpatient Hospital Stay
Admit: 2017-06-30 | Discharge: 2017-07-05 | Disposition: A | Discharge status: Home / Self Care | Payer: MEDICARE | Admitting: Cardiovascular Disease

## 2017-06-30 DIAGNOSIS — R079 Chest pain, unspecified: ICD-10-CM

## 2017-06-30 LAB — CBC AND DIFF
Lab: 0 10*3/uL (ref 0–0.20)
Lab: 0.2 10*3/uL (ref 0–0.45)
Lab: 6.4 10*3/uL (ref 4.5–11.0)

## 2017-06-30 LAB — COMPREHENSIVE METABOLIC PANEL
Lab: 136 MMOL/L — ABNORMAL LOW (ref 137–147)
Lab: 15 U/L (ref 7–40)
Lab: 27 MMOL/L (ref 21–30)
Lab: 4.2 g/dL (ref 3.5–5.0)
Lab: 6 K/UL (ref 3–12)
Lab: 81 U/L — ABNORMAL LOW (ref 25–110)
Lab: 9 U/L (ref 7–56)
Lab: >60 mL/min (ref >60)
Lab: >60 mL/min — ABNORMAL LOW (ref >60)

## 2017-06-30 LAB — LIPID PROFILE
Lab: 137 mg/dL (ref 0.3–1.2)
Lab: 18 mg/dL (ref 6.0–8.0)
Lab: 206 mg/dL — ABNORMAL HIGH (ref <200)
Lab: 89 mg/dL (ref <150)

## 2017-06-30 LAB — BNP (B-TYPE NATRIURETIC PEPTI): Lab: 373 pg/mL — ABNORMAL HIGH (ref >40)

## 2017-06-30 LAB — PTT (APTT): Lab: 196 s — ABNORMAL HIGH (ref 24.0–36.5)

## 2017-06-30 LAB — MAGNESIUM: Lab: 2 mg/dL (ref 1.6–2.6)

## 2017-06-30 LAB — TROPONIN-I

## 2017-06-30 LAB — TSH WITH FREE T4 REFLEX: Lab: 2.2 uU/mL — ABNORMAL HIGH (ref <100)

## 2017-06-30 IMAGING — CR CHEST
1 series · 1 of 1 positions shown · non-contrast
Comparison: none

[chest port x-wise]
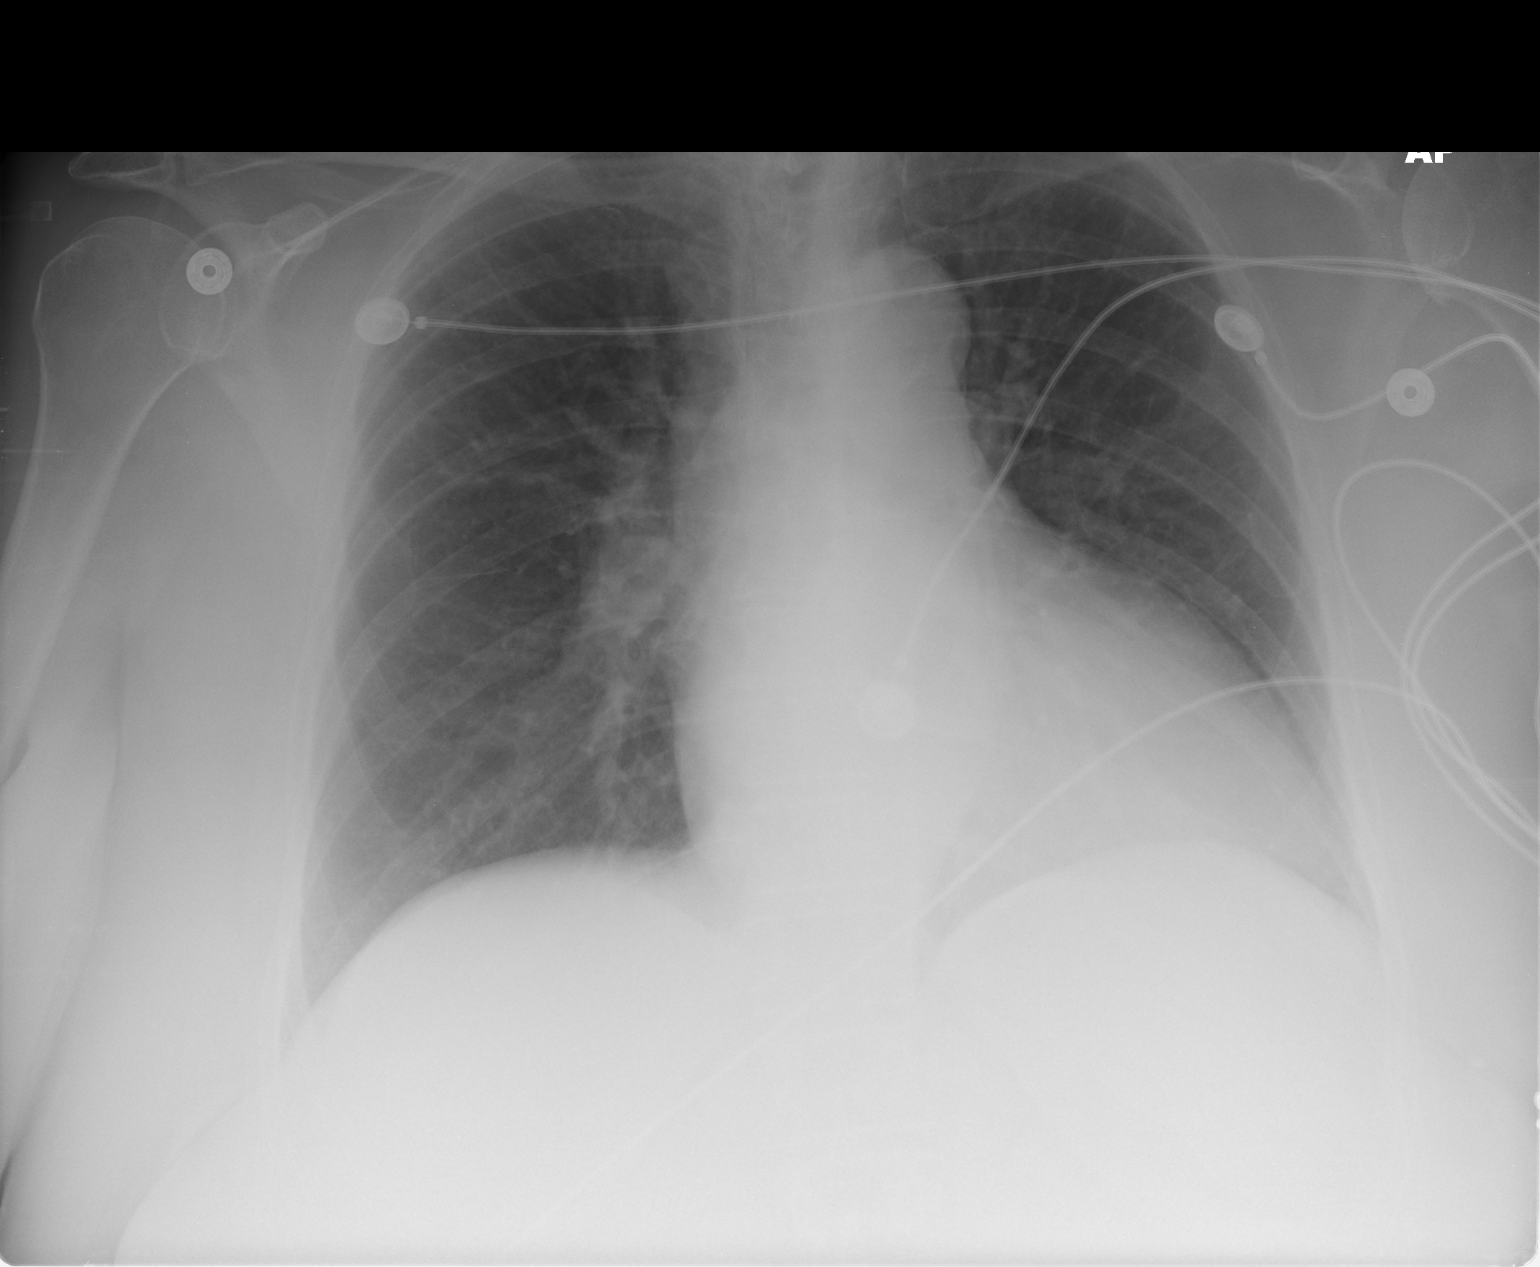

[1 of 1 positions shown; findings below may reference images not displayed]

DIAGNOSTIC STUDIES

EXAM

Chest.

INDICATION

Chest pain
PT STATES CHEST PAIN SINCE MIDNIGHT. HX OF A-FIB. SB/AB

TECHNIQUE

AP chest.

COMPARISONS

None.

FINDINGS

Frontal view of the chest shows no consolidative airspace disease, pleural effusion, or
pneumothorax. The cardiac silhouette is likely at upper limits of normal in size. Mildly thickened
interstitial markings.

IMPRESSION

No compelling evidence of acute cardiopulmonary pathology.

## 2017-06-30 IMAGING — US ABDLM
1 series · 14 of 25 positions shown · non-contrast
Comparison: none

[Series 1: us abdomen limited · 14 of 82 slices shown]
[im 1/82]
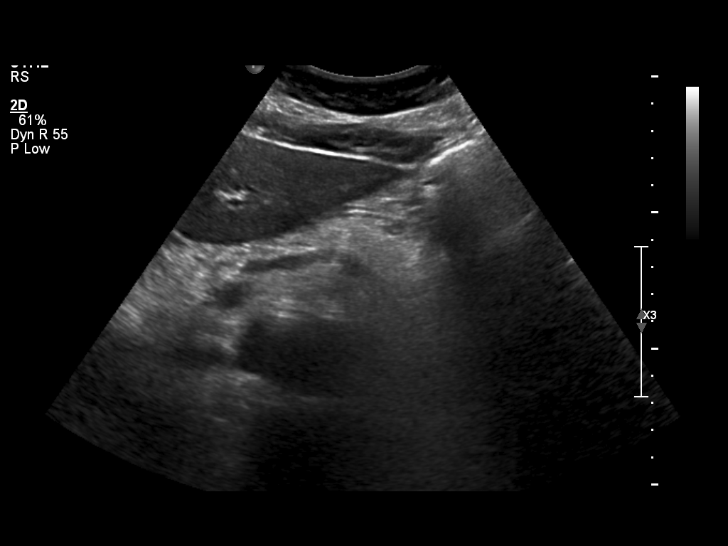
[im 7/82]
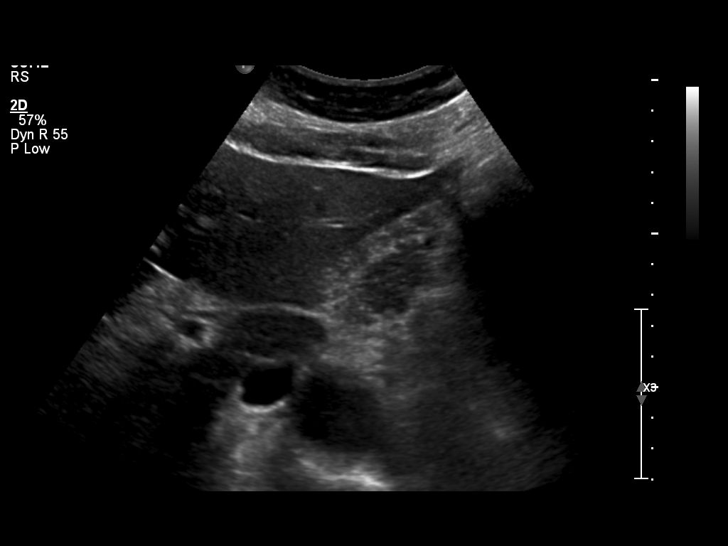
[im 14/82]
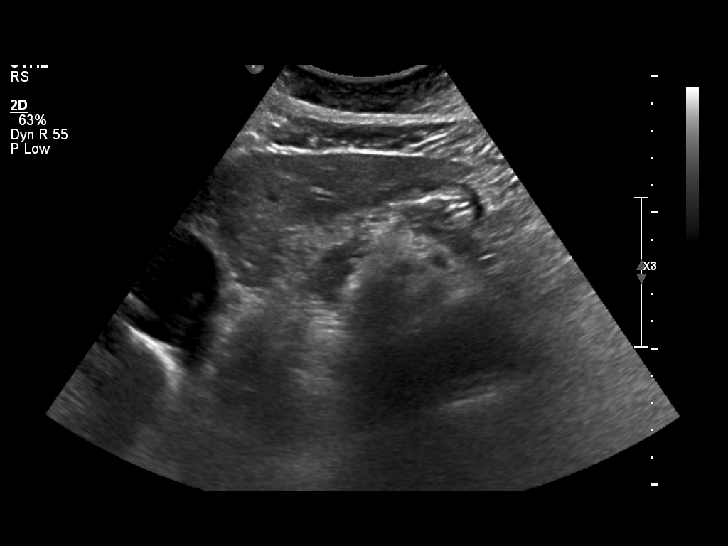
[im 21/82]
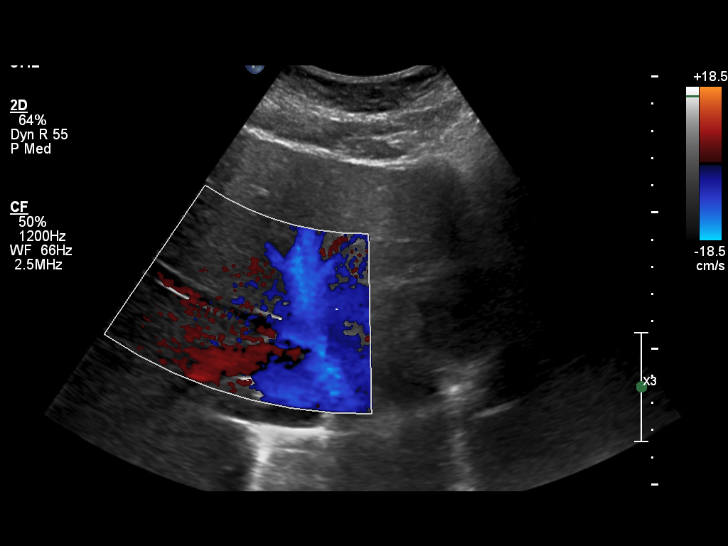
[im 28/82]
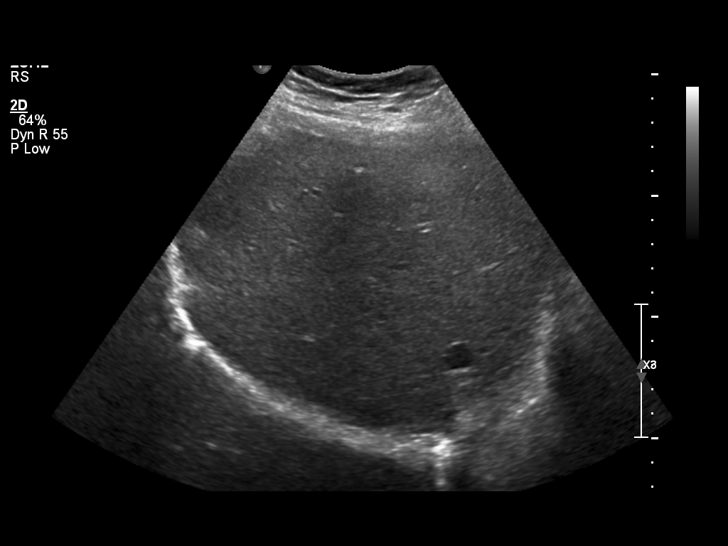
[im 31/82]
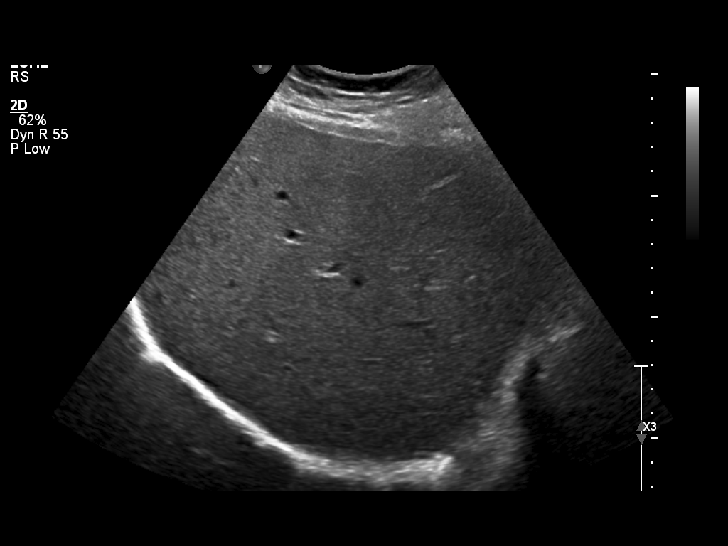
[im 38/82]
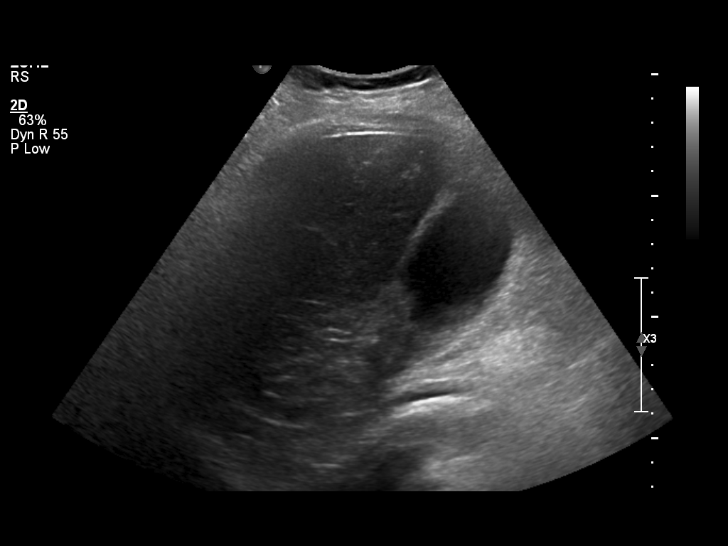
[im 44/82]
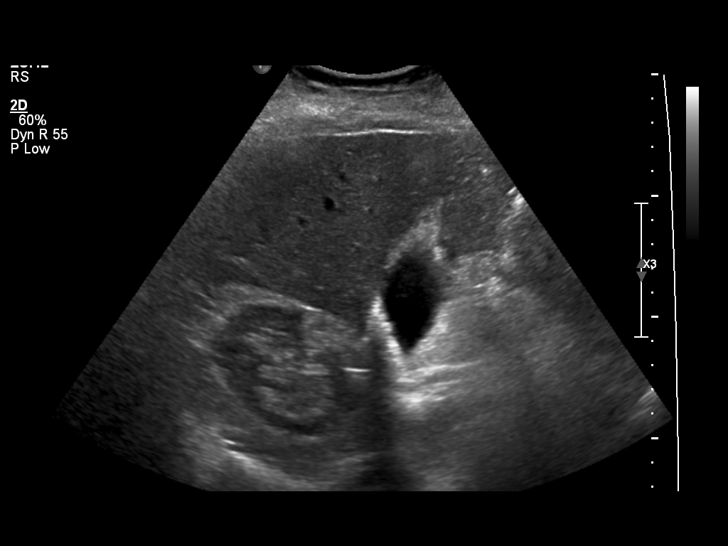
[im 51/82]
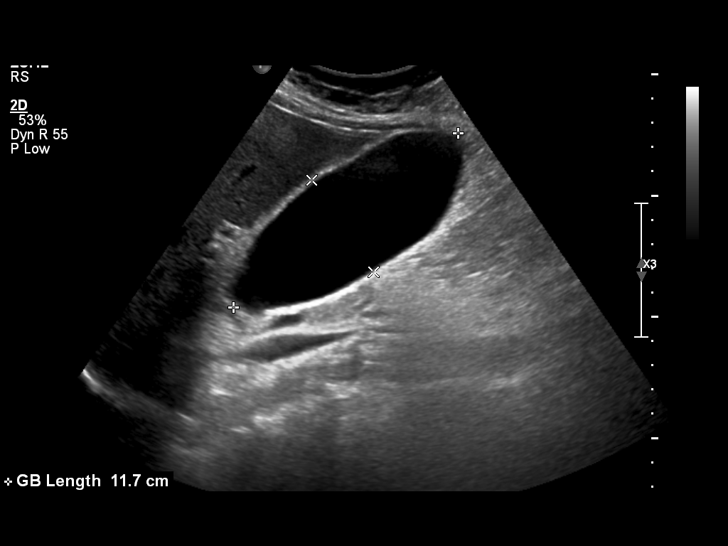
[im 55/82]
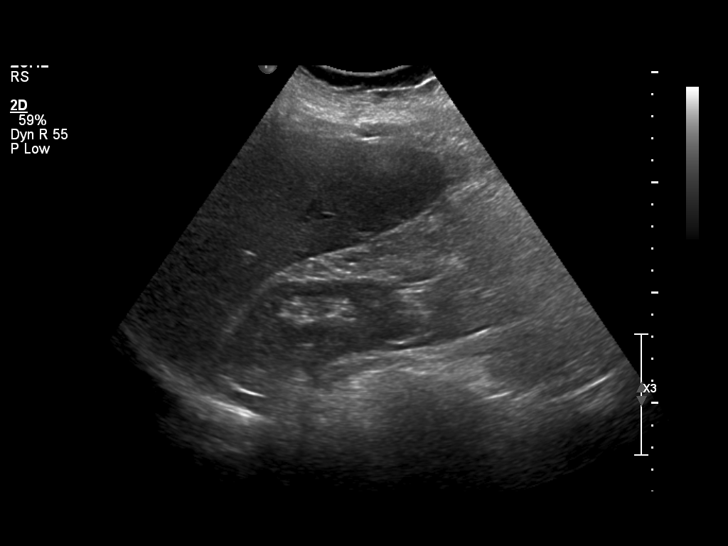
[im 61/82]
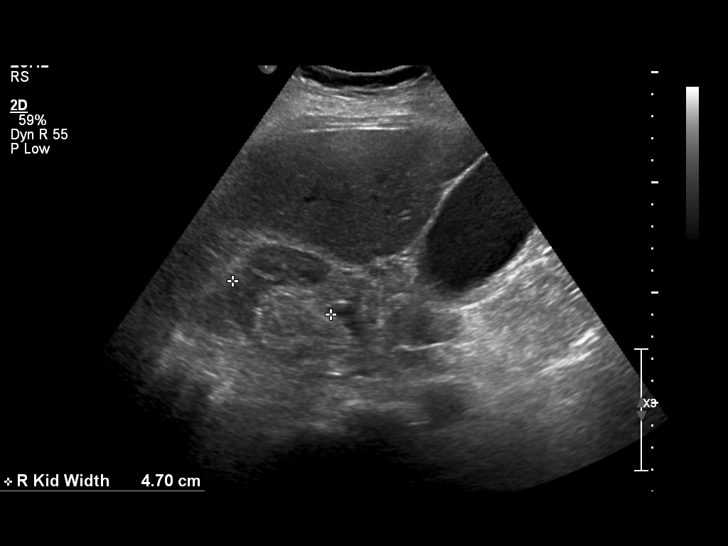
[im 68/82]
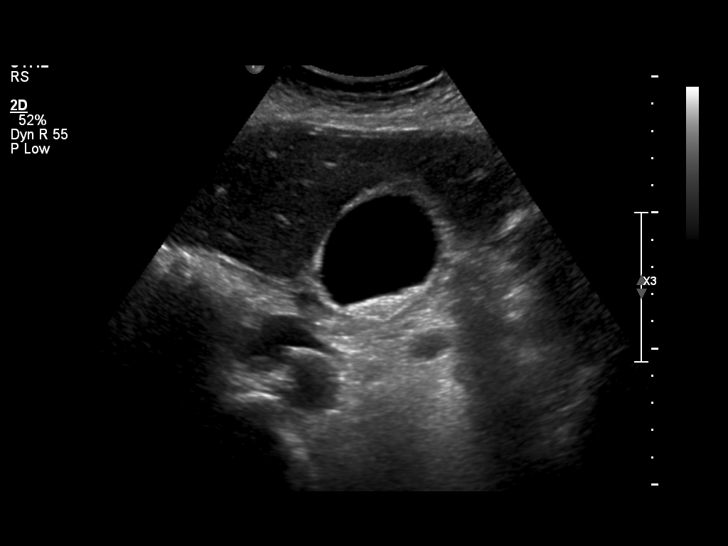
[im 75/82]
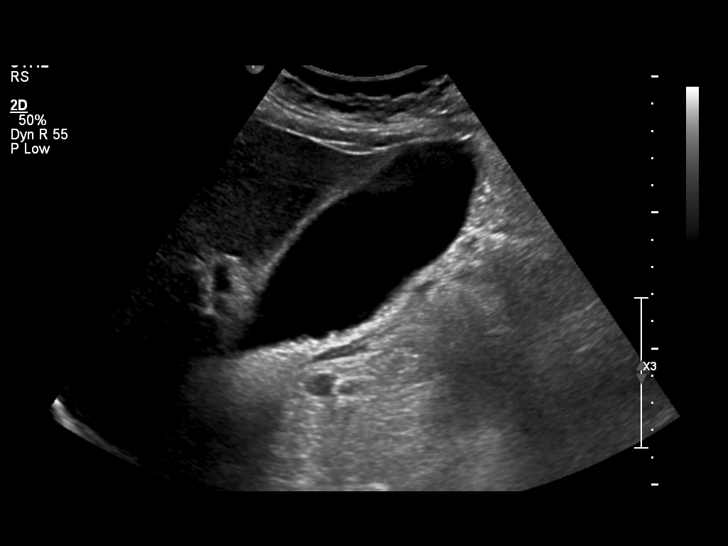
[im 82/82]
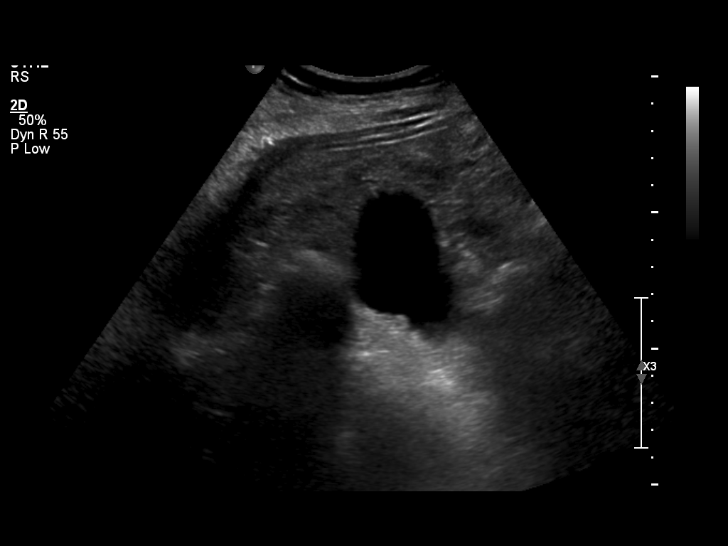

[14 of 25 positions shown; findings below may reference images not displayed]

ULTRASOUND REPORT

DIAGNOSTIC STUDIES

EXAM

Right upper quadrant abdomen ultrasound.

INDICATION

Chest pain, epigastric pain, RUQ pain
RUQ PAIN; EPIGASTRIC/CHEST PAIN

TECHNIQUE

High-resolution grayscale and color Doppler interrogation of the right upper quadrant.

COMPARISONS

None.

FINDINGS

The pancreas as imaged is unremarkable, though the tail is partially obscured by bowel gas.

There is no aneurysmal dilatation of the imaged abdominal aorta. The IVC is patent.

The liver measures 17.0 centimeters in length. There is a small hypoechoic area at the posterior
aspect of the right hepatic lobe measuring up to 12 x 10 millimeters in dimension with posterior
acoustic enhancement, most likely a small hepatic cyst. No evidence of vascular flow within this
hypoechoic lesion.

The gallbladder wall is not thickened. There is no pericholecystic fluid or cholelithiasis.

The right kidney measures 10.4 x 4.6 x 4.7 centimeters in dimension and shows no hydronephrosis.
Suggestion of renal cortical thinning, suspicious for chronic medical renal disease. Correlate
clinically.

IMPRESSION

Probable simple hepatic cyst. Normal gallbladder.

## 2017-06-30 MED ORDER — HEPARIN (PORCINE) IN 5 % DEX 20,000 UNIT/500 ML (40 UNIT/ML) IV SOLP
0-2000 [IU]/h | INTRAVENOUS | 0 refills | Status: CN
Start: 2017-06-30 — End: ?

## 2017-06-30 MED ORDER — ATORVASTATIN 40 MG PO TAB
80 mg | Freq: Every day | ORAL | 0 refills | Status: DC
Start: 2017-06-30 — End: 2017-07-05
  Administered 2017-07-01 – 2017-07-05 (×6): 80 mg via ORAL

## 2017-06-30 MED ORDER — NITROGLYCERIN 0.4 MG SL SUBL
.4 mg | SUBLINGUAL | 0 refills | Status: DC | PRN
Start: 2017-06-30 — End: 2017-07-05
  Administered 2017-07-02 – 2017-07-04 (×5): 0.4 mg via SUBLINGUAL

## 2017-06-30 MED ORDER — DILTIAZEM HCL 120 MG PO CP24
120 mg | Freq: Every day | ORAL | 0 refills | Status: DC
Start: 2017-06-30 — End: 2017-07-05
  Administered 2017-07-01 – 2017-07-05 (×5): 120 mg via ORAL

## 2017-06-30 MED ORDER — HEPARIN (PORCINE) BOLUS FOR CONTINUOUS INF (BAG)
4000 [IU] | Freq: Once | INTRAVENOUS | 0 refills | Status: CN
Start: 2017-06-30 — End: ?

## 2017-06-30 MED ORDER — APIXABAN 5 MG PO TAB
5 mg | Freq: Two times a day (BID) | ORAL | 0 refills | Status: DC
Start: 2017-06-30 — End: 2017-07-01
  Administered 2017-07-01 (×2): 5 mg via ORAL

## 2017-06-30 MED ORDER — HEPARIN (PORCINE) BOLUS FOR CONTINUOUS INF (BAG)
20-40 [IU]/kg | INTRAVENOUS | 0 refills | Status: CN
Start: 2017-06-30 — End: ?

## 2017-06-30 NOTE — Progress Notes
RT Adult Assessment Note    NAME:Karen Horton             MRN: [id number]             DOB:1941/06/07          AGE: 76 y.o.  ADMISSION DATE: 06/30/2017             DAYS ADMITTED: LOS: 0 days    RT Treatment Plan:   Criteria not met.       Vital Signs:  Pulse: Pulse: 85  RR: Respirations: 20 PER MINUTE  SpO2: SpO2: 96 %  O2 Device:    Liter Flow:    O2%:    Breath Sounds:    Respiratory Effort:

## 2017-06-30 NOTE — Progress Notes
Pt arrived at 1510 via EMS. Ambulated to bed with SBA, steady on feet. Afib on tele rate controlled. VSS. Oriented to unit and room. Pt c/o chest pain in the lower sternum rating at 2/10 which is improved from earlier. Labs sent, EKG and X-ray obtained. Will continue to monitor.

## 2017-06-30 NOTE — Progress Notes
Patient is well [facility] to [facility] where she is followed for A-fib. Patient had heartburn over night as well as chest pain radiating for a while to left arm, She is chest pain free now.

## 2017-07-01 ENCOUNTER — Encounter: Admit: 2017-07-01 | Discharge: 2017-07-01 | Payer: MEDICARE

## 2017-07-01 ENCOUNTER — Inpatient Hospital Stay: Admit: 2017-07-01 | Discharge: 2017-07-01 | Payer: MEDICARE

## 2017-07-01 DIAGNOSIS — Z8673 Personal history of transient ischemic attack (TIA), and cerebral infarction without residual deficits: ICD-10-CM

## 2017-07-01 DIAGNOSIS — I10 Essential (primary) hypertension: Secondary | ICD-10-CM

## 2017-07-01 DIAGNOSIS — E785 Hyperlipidemia, unspecified: ICD-10-CM

## 2017-07-01 LAB — COMPREHENSIVE METABOLIC PANEL: Lab: 135 MMOL/L — ABNORMAL LOW (ref >60)

## 2017-07-01 LAB — HEMOGLOBIN A1C: Lab: 5.5 % (ref 4.0–6.0)

## 2017-07-01 LAB — TROPONIN-I

## 2017-07-01 LAB — CBC AND DIFF: Lab: 5.3 K/UL — ABNORMAL LOW (ref >60)

## 2017-07-01 LAB — MAGNESIUM: Lab: 2.2 mg/dL — ABNORMAL LOW (ref >60)

## 2017-07-01 MED ORDER — HEPARIN (PORCINE) IN 5 % DEX 20,000 UNIT/500 ML (40 UNIT/ML) IV SOLP
0-2000 [IU]/h | INTRAVENOUS | 0 refills | Status: DC
Start: 2017-07-01 — End: 2017-07-04
  Administered 2017-07-02: 04:00:00 1443 [IU]/h via INTRAVENOUS
  Administered 2017-07-02: 14:00:00 1243 [IU]/h via INTRAVENOUS
  Administered 2017-07-03: 09:00:00 1043 [IU]/h via INTRAVENOUS
  Administered 2017-07-04: 18:00:00 1143 [IU]/h via INTRAVENOUS
  Administered 2017-07-04: 01:00:00 1243 [IU]/h via INTRAVENOUS

## 2017-07-01 MED ORDER — ASPIRIN 325 MG PO TAB
325 mg | Freq: Once | ORAL | 0 refills | Status: CP
Start: 2017-07-01 — End: ?
  Administered 2017-07-01: 15:00:00 325 mg via ORAL

## 2017-07-01 MED ORDER — HEPARIN (PORCINE) BOLUS FOR CONTINUOUS INF (BAG)
70 [IU]/kg | Freq: Once | INTRAVENOUS | 0 refills | Status: CP
Start: 2017-07-01 — End: ?

## 2017-07-01 MED ORDER — ASPIRIN 81 MG PO TBEC
81 mg | Freq: Every day | ORAL | 0 refills | Status: DC
Start: 2017-07-01 — End: 2017-07-05
  Administered 2017-07-02 – 2017-07-04 (×3): 81 mg via ORAL

## 2017-07-01 MED ORDER — PANTOPRAZOLE 40 MG IV SOLR
40 mg | Freq: Once | INTRAVENOUS | 0 refills | Status: CP
Start: 2017-07-01 — End: ?
  Administered 2017-07-02: 04:00:00 40 mg via INTRAVENOUS

## 2017-07-01 MED ORDER — SODIUM CHLORIDE 0.9 % IV SOLP
250 mL | INTRAVENOUS | 0 refills | Status: AC | PRN
Start: 2017-07-01 — End: ?

## 2017-07-01 MED ORDER — HEPARIN (PORCINE) BOLUS FOR CONTINUOUS INF (BAG)
20-40 [IU]/kg | INTRAVENOUS | 0 refills | Status: DC
Start: 2017-07-01 — End: 2017-07-04

## 2017-07-01 MED ORDER — PANTOPRAZOLE(#) 2MG/ML PO SUSP
40 mg | Freq: Every day | ORAL | 0 refills | Status: DC
Start: 2017-07-01 — End: 2017-07-05
  Administered 2017-07-02 – 2017-07-05 (×4): 40 mg via ORAL

## 2017-07-01 NOTE — Case Management (ED)
Case Management Admission Assessment    NAME:Karen Horton                          MRN: [id number]             DOB:1941/04/10          AGE: 76 y.o.  ADMISSION DATE: 06/30/2017             DAYS ADMITTED: LOS: 1 day      Today???s Date: 07/01/2017    Source of Information: *patient       Plan  Plan: Case Management Assessment, Assist PRN with SW/NCM Services, Discharge Planning for Home Anticipated   *RNCM met with patient and provided contact information and explanation of CM roles. The patient was encouraged to contact case management with questions and concerns during hospitalization.  *The CM team will follow patient through course of stay and assist with discharge planning.   *Pt does not anticipate any further needs from CM. She denies need for DMEs, assistance paying for prescriptions, PT/OT/ST, and transportation.  *Patient states she has a lot of support from family at home to help her if she needs it and to help with her husband.   *LHC on 07/04/17    Patient Address/Phone  [address]  [place] [address]  [phone] (home)     Emergency Contact  Extended Emergency Contact Information  Primary Emergency Contact: Hengel,Clarence  Address: [address]           [place] [address] [place]  Home Phone: [phone]  Work Phone: [phone]  Mobile Phone: [phone]  Relation: Spouse  Secondary Emergency Contact: Taliaferro,Debbie  Home Phone: [phone]  Mobile Phone: [phone]  Relation: Daughter    Healthcare Directive         Transportation  Does the patient need discharge transport arranged?: No  Transportation Name, Phone and Availability #1: family  Does the patient use Medicaid Transportation?: No    Expected Discharge Date  Expected Discharge Date: 07/05/17    Living Situation Prior to Admission  ? Living Arrangements  Type of Residence: Home, independent  *Patient drives herself to her doctor's appointments.   Living Arrangements: Spouse/significant other(patient is the caregiver for her spouse)  Financial risk analyst / Tub: Walk-in Shower(grab bars in shower)  How many levels in the residence?: 1  Can patient live on one level if needed?: Yes  Does residence have entry and/or side stairs?: Yes(3 steps into home; pateint denies difficulty with stairs prior to admission)  Assistance needed prior to admit or anticipated on discharge: No  Who provides assistance or could if needed?: 2 sisters in town, son who lives 5 blocks away   Are they in good health?: Unknown  Can support system provide 24/7 care if needed?: Maybe  ? Level of Function   Prior level of function: Independent  ? Cognitive Abilities   Cognitive Abilities: Alert and Oriented, Engages in problem solving and planning, Participates in decision making    Financial Resources  ? Coverage  Primary Insurance: Medicare  Secondary Insurance: Medicare Supplement([place] Supplement)  Additional Coverage: RX(UHC/AARP)    ? Source of Income   Source Of Income: SSI, Other retirement income  ? Financial Assistance Needed?  No, patient denies need for assistance. She is currently in the donut hole but will be fine until 07/05/17.     Psychosocial Needs  ? Mental Health  Mental Health History: No  ? Substance  Use History  Substance Use History Screen: Yes  Comment: pateint denies tobacco and recreational drug use; patient states she drinks 3-4 cocktails every night  ? Other  na    Current/Previous Services  ? PCP  Gwenette Greet, [phone], [phone]    *Cardiologist: MD Danella Maiers @[facility]  phone: [phone] (she sees MD Barry Dienes when he comes to [place]).    ? Pharmacy    [facility] [address] [place] [address] [place] [address] [place] [address]  [place] [address]  Phone: [phone] Fax: [phone]    [facility] - [place], [place] - [address] [facility]  [address] [place]  [address]  [place] [address]  Phone: [phone] Fax: [phone]    ? Durable Medical Equipment   Durable Medical Equipment at home: None  ? Home Health Receiving home health: No  ? Hemodialysis or Peritoneal Dialysis  Undergoing hemodialysis or peritoneal dialysis: No  ? Tube/Enteral Feeds  Receive tube/enteral feeds: No  ? Infusion  Receive infusions: No  ? Private Duty  Private duty help used: No  ? Home and Community Based Services  Home and community based services: No  ? [employer]: N/A  ? Hospice  Hospice: No  ? Outpatient Therapy  PT: No  OT: No  SLP: No  ? Skilled Nursing Facility/Nursing Home  SNF: No  NH: No  ? Inpatient Rehab  IPR: No  ? Long-Term Acute Care Hospital  LTACH: No  ? Acute Hospital Stay  Acute Hospital Stay: In the past  Was patient's stay within the last 30 days?: No    Verdie Shire RN, BSN, MSN  Integrated Nursing Case Manager  Office [phone] M-F 8-5 pm

## 2017-07-01 NOTE — Progress Notes
General Progress Note    Name:  Karen Horton   [id number]'[id number] Date:  07/01/2017  Admission Date: 06/30/2017  LOS: 1 day                     Assessment/Plan:    Active Problems:    Chest pain    Patient is a 76 year old female with PMH of permanent atrial fibrillation, HTN, HLD right MCA stroke presenting with chest pain on 12/27.  ???  # Chest Pain  # Unstable Angina  - Reports severe chest pressure at rest. Radiates to L arm. Improved with nitro. 2/2 Unstable angina / Reflux  - EKG with A-fib with rate 80s, Intervals wnl. No ST changes.  - MPI stress 03/16/17: This study is probably normal. ???There is evidence of soft tissue attenuation, a significant ischemic change was not appreciated. ???Left ventricular systolic function is normal, the ejection fraction was 70%. There are no high risk prognostic indicators present. ???The pulmonary to myocardial count ratio is normal at 0.40, there is no transient ischemic dilatation noted. ???The ECG portion of the study is negative for ischemia.  - Echo 04/24/14: There is limited endocardial definition. No regional wall motion abnormalities are seen. Overall LV systolic function appears normal. The estimated left ventricular ejection fraction is 55%. There is mild concentric left ventricular hypertrophy. Right ventricular contractility appears normal. Moderate left atrial enlargement. Mild right atrial enlargement. Mild tricuspid valve regurgitation. No pericardial effusion is seen.  - Trop 0.01 on admission --> 0.01 --> 0.01  - Chest Xray 12/27 with no consolidation and cardiomegaly  - Echo 12/28 with moderately dilated LA, mild LVH, LVEF 60%  - EKG 12/28 am without any ST elevation                > troponin trend remains negative; however patient with intermittent chest pain episodes while at rest in her hospital bed, so will keep inpatient until [facility] is performed as detailed below               > ASA 81 qd               > LHC tentatively 08/04/17 (initially considered 12/28, but she had taken 12/27 dose of eliquis; will stop further eliquis doses and continue with heparin gtt through heart cath)               > Nitro tab PRN for chest pain                > Repeat EKG  ???  # Permanent Atrial Fibrillation   - PTA Eliquis               > PTA Eliquis on hold with plan for LHC on 08/04/17; continue heparin gtt in interim               > Continue Dilt for rate control as below                 # HTN   - PTA Diltiazem CD 120               > Continue Diltiazem  ???  # HLD  - PTA Simvastatin 10 mg  - Lipid: Cho 206, Tri 89, HDL 69, LDL 131               > Increased to Atorvastatin 80 mg  ???  FEN: No IVF, Replace lytes PRN, NPO  PPx: PTA eliquis  changed to heparin in anticipation for LHC  Code: Full   ???  Seen/discussed with Dr. Norlene Campbell, MD, Yet[facility] Numbers  [phone], team pager [phone]  ________________________________________________________________________    Subjective  Karen Horton is a 76 y.o. female.  Patient with no events overnight. Her and family are frustrated by not having cath today due to taking eliquis last evening. They understand this is being postponed due to bleeding risk. They have elected to remain inpatient until [facility] is performed. Patient with intermittent chest pain episodes at rest in bed. No dyspnea, no abdominal pain.     Medications  Scheduled Meds:  apixaban (ELIQUIS) tablet 5 mg 5 mg Oral BID   aspirin tablet 325 mg 325 mg Oral ONCE   atorvastatin (LIPITOR) tablet 80 mg 80 mg Oral QDAY   diltiazem CD (cardIZEM CD) capsule 120 mg 120 mg Oral QDAY   Continuous Infusions:  PRN and Respiratory Meds:nitroglycerin Q5 MIN PRN, sodium chloride 0.9% (NS) PRN    Objective:                          Vital Signs: Last Filed                 Vital Signs: 24 Hour Range   BP: 132/88 (12/28 0325)  Temp: 36.7 ???C (98.1 ???F) (12/28 0325)  Pulse: 95 (12/28 0325)  Respirations: 16 PER MINUTE (12/28 0325)  SpO2: 95 % (12/28 0325)  O2 Delivery: None (Room Air) (12/28 0325) Height: 165.1 cm (65) (12/27 1516) BP: (131-177)/(69-109)   Temp:  [36.3 ???C (97.3 ???F)-36.8 ???C (98.2 ???F)]   Pulse:  [85-105]   Respirations:  [16 PER MINUTE-20 PER MINUTE]   SpO2:  [95 %-97 %]   O2 Delivery: None (Room Air)   Intensity Pain Scale (Self Report): 2 (06/30/17 1516) Vitals:    06/30/17 1516   Weight: 96.6 kg (212 lb 15.4 oz)       Intake/Output Summary:  (Last 24 hours)    Intake/Output Summary (Last 24 hours) at 07/01/2017 0611  Last data filed at 07/01/2017 0555  Gross per 24 hour   Intake 350 ml   Output 550 ml   Net -200 ml           Physical Exam  General: no acute distress  Eyes: EOMI, non-injected conjunctiva, vision grossly intact  ENT: external ears normal, no nasal drainage, moist mucous membranes  Neck: supple, no JVD, no lymphadenopathy  Respiratory: lungs CTA bilaterally  Cardiovascular: irregularly irregular in 80s, normal S1 and S2, no murmur/click/rub  Abdomen: soft, non-tender, non-distended, active bowel sounds, no masses or organomegaly  Extremities/MSK: normal ROM, no cyanosis or swelling  Neuro: no focal deficits, alert and oriented to person/place/time  Skin: no rash    Lab Review  24-hour labs:    Results for orders placed or performed during the hospital encounter of 06/30/17 (from the past 24 hour(s))   CBC AND DIFF    Collection Time: 06/30/17  4:15 PM   Result Value Ref Range    White Blood Cells 6.4 4.5 - 11.0 K/UL    RBC 4.22 4.0 - 5.0 M/UL    Hemoglobin 13.6 12.0 - 15.0 GM/DL    Hematocrit 65.7 36 - 45 %    MCV 97.3 80 - 100 FL    MCH 32.3 26 - 34 PG    MCHC 33.2 32.0 - 36.0 G/DL    RDW 84.6 11 - 15 %  Platelet Count 252 150 - 400 K/UL    MPV 7.8 7 - 11 FL    Neutrophils 72 41 - 77 %    Lymphocytes 14 (L) 24 - 44 %    Monocytes 11 4 - 12 %    Eosinophils 3 0 - 5 %    Basophils 0 0 - 2 %    Absolute Neutrophil Count 4.50 1.8 - 7.0 K/UL    Absolute Lymph Count 0.90 (L) 1.0 - 4.8 K/UL    Absolute Monocyte Count 0.70 0 - 0.80 K/UL Absolute Eosinophil Count 0.20 0 - 0.45 K/UL    Absolute Basophil Count 0.00 0 - 0.20 K/UL   COMPREHENSIVE METABOLIC PANEL    Collection Time: 06/30/17  4:15 PM   Result Value Ref Range    Sodium 136 (L) 137 - 147 MMOL/L    Potassium 3.7 3.5 - 5.1 MMOL/L    Chloride 103 98 - 110 MMOL/L    Glucose 98 70 - 100 MG/DL    Blood Urea Nitrogen 13 7 - 25 MG/DL    Creatinine 7.82 0.4 - 1.00 MG/DL    Calcium 95.6 8.5 - 21.3 MG/DL    Total Protein 7.2 6.0 - 8.0 G/DL    Total Bilirubin 0.7 0.3 - 1.2 MG/DL    Albumin 4.2 3.5 - 5.0 G/DL    Alk Phosphatase 81 25 - 110 U/L    AST (SGOT) 15 7 - 40 U/L    CO2 27 21 - 30 MMOL/L    ALT (SGPT) 9 7 - 56 U/L    Anion Gap 6 3 - 12    eGFR Non African American >60 >60 mL/min    eGFR African American >60 >60 mL/min   MAGNESIUM    Collection Time: 06/30/17  4:15 PM   Result Value Ref Range    Magnesium 2.0 1.6 - 2.6 mg/dL   LIPID PROFILE    Collection Time: 06/30/17  4:15 PM   Result Value Ref Range    Cholesterol 206 (H) <200 MG/DL    Triglycerides 89 <086 MG/DL    HDL 69 >57 MG/DL    LDL 846 (H) <962 MG/DL    VLDL 18 MG/DL    Non HDL Cholesterol 137 MG/DL   BNP (B-TYPE NATRIURETIC PEPTI)    Collection Time: 06/30/17  4:15 PM   Result Value Ref Range    B Type Natriuretic Peptide 373.0 (H) 0 - 100 PG/ML   TSH WITH FREE T4 REFLEX    Collection Time: 06/30/17  4:15 PM   Result Value Ref Range    TSH 2.240 0.35 - 5.00 MCU/ML   TROPONIN-I    Collection Time: 06/30/17  4:15 PM   Result Value Ref Range    Troponin-I <0.01 0.0 - 0.05 NG/ML   TROPONIN-I    Collection Time: 06/30/17  7:16 PM   Result Value Ref Range    Troponin-I <0.01 0.0 - 0.05 NG/ML   CBC AND DIFF    Collection Time: 07/01/17  2:05 AM   Result Value Ref Range    White Blood Cells 5.3 4.5 - 11.0 K/UL    RBC 4.07 4.0 - 5.0 M/UL    Hemoglobin 13.2 12.0 - 15.0 GM/DL    Hematocrit 95.2 36 - 45 %    MCV 96.4 80 - 100 FL    MCH 32.5 26 - 34 PG    MCHC 33.7 32.0 - 36.0 G/DL    RDW 84.1 11 - 15 %  Platelet Count 249 150 - 400 K/UL MPV 8.1 7 - 11 FL    Neutrophils 63 41 - 77 %    Lymphocytes 19 (L) 24 - 44 %    Monocytes 13 (H) 4 - 12 %    Eosinophils 4 0 - 5 %    Basophils 1 0 - 2 %    Absolute Neutrophil Count 3.40 1.8 - 7.0 K/UL    Absolute Lymph Count 1.00 1.0 - 4.8 K/UL    Absolute Monocyte Count 0.70 0 - 0.80 K/UL    Absolute Eosinophil Count 0.20 0 - 0.45 K/UL    Absolute Basophil Count 0.00 0 - 0.20 K/UL   COMPREHENSIVE METABOLIC PANEL    Collection Time: 07/01/17  2:05 AM   Result Value Ref Range    Sodium 135 (L) 137 - 147 MMOL/L    Potassium 3.9 3.5 - 5.1 MMOL/L    Chloride 103 98 - 110 MMOL/L    Glucose 106 (H) 70 - 100 MG/DL    Blood Urea Nitrogen 14 7 - 25 MG/DL    Creatinine 1.61 0.4 - 1.00 MG/DL    Calcium 9.6 8.5 - 09.6 MG/DL    Total Protein 6.6 6.0 - 8.0 G/DL    Total Bilirubin 0.7 0.3 - 1.2 MG/DL    Albumin 3.7 3.5 - 5.0 G/DL    Alk Phosphatase 76 25 - 110 U/L    AST (SGOT) 17 7 - 40 U/L    CO2 27 21 - 30 MMOL/L    ALT (SGPT) 11 7 - 56 U/L    Anion Gap 5 3 - 12    eGFR Non African American >60 >60 mL/min    eGFR African American >60 >60 mL/min   MAGNESIUM    Collection Time: 07/01/17  2:05 AM   Result Value Ref Range    Magnesium 2.2 1.6 - 2.6 mg/dL   TROPONIN-I    Collection Time: 07/01/17  2:05 AM   Result Value Ref Range    Troponin-I <0.01 0.0 - 0.05 NG/ML       Point of Care Testing  (Last 24 hours)  Glucose: (!) 106 (07/01/17 0205)    Radiology and other Diagnostics Review:    Pertinent radiology reviewed.    Marlane Mingle, MD

## 2017-07-01 NOTE — Progress Notes
Assumed care of pt at 0700, assessments complete, VSS, afib on tele.  Ambulating ad lib in room and on unit.   Voiding adequately, BM this morning.   Tolerating cardiac diet.   Denies CP this shift.   Will continue to monitor.

## 2017-07-01 NOTE — Progress Notes
OCCUPATIONAL THERAPY  NOTE     Per discussion with PT, patient reports no concerns with completing activities of daily living with no OT goals identified. OT will discontinue service, please re-consult if the patient has a decline in functional status.     Therapist: Darlin PriestlyLauren Jalissa Heinzelman, OTR/L [phone]  Date: 07/01/2017

## 2017-07-01 NOTE — Progress Notes
PHYSICAL THERAPY  NOTE       Patient denies changes from baseline mobility and reports no history of balance loss or falls in the last three months.  Patient endorses no concerns with mobility at home.  Currently, the patient is ambulating on the unit without difficulty.       Encouraged patient to continue mobilization while in the hospital.  Physical therapy services will be discontinued at this time, please re-consult if the patient has a change in mobility status.          Therapist: Collins Scotlandommy Van Towle, PT, DPT  Date: 07/01/2017

## 2017-07-02 LAB — CBC
Lab: 13 g/dL (ref 12.0–15.0)
Lab: 251 10*3/uL (ref 150–400)
Lab: 32 pg (ref 26–34)
Lab: 33 g/dL (ref 32.0–36.0)
Lab: 39 % (ref 36–45)
Lab: 4.1 M/UL (ref 4.0–5.0)
Lab: 5.7 10*3/uL (ref 4.5–11.0)
Lab: 7.7 FL (ref 7–11)
Lab: 96 FL (ref 80–100)

## 2017-07-02 LAB — CBC AND DIFF
Lab: 0 10*3/uL (ref 0–0.20)
Lab: 0.2 10*3/uL (ref 0–0.45)
Lab: 0.7 10*3/uL (ref 0–0.80)
Lab: 1 % (ref >60)
Lab: 1 10*3/uL (ref 1.0–4.8)
Lab: 12 % (ref 4–12)
Lab: 19 % — ABNORMAL LOW (ref 24–44)
Lab: 236 K/UL (ref 150–400)
Lab: 3.6 10*3/uL (ref 1.8–7.0)
Lab: 3.9 M/UL — ABNORMAL LOW (ref 4.0–5.0)
Lab: 32 pg (ref 26–34)
Lab: 33 g/dL (ref 32.0–36.0)
Lab: 38 % (ref 36–45)
Lab: 4 % (ref >60)
Lab: 5.5 10*3/uL (ref 4.5–11.0)
Lab: 64 % (ref 41–77)
Lab: 8.2 FL (ref 7–11)

## 2017-07-02 LAB — COMPREHENSIVE METABOLIC PANEL
Lab: 137 MMOL/L (ref 137–147)
Lab: 3.6 MMOL/L (ref 3.5–5.1)

## 2017-07-02 LAB — PTT (APTT)
Lab: 33 s (ref 24.0–36.5)
Lab: 187 s — ABNORMAL HIGH (ref 24.0–36.5)
Lab: 63 s — ABNORMAL HIGH (ref 24.0–36.5)

## 2017-07-02 LAB — MAGNESIUM: Lab: 2.2 mg/dL (ref 1.6–2.6)

## 2017-07-02 LAB — TROPONIN-I

## 2017-07-02 MED ORDER — TICAGRELOR 90 MG PO TAB
90 mg | Freq: Two times a day (BID) | ORAL | 0 refills | Status: DC
Start: 2017-07-02 — End: 2017-07-02

## 2017-07-02 MED ORDER — POTASSIUM CHLORIDE 20 MEQ PO TBTQ
40 meq | Freq: Once | ORAL | 0 refills | Status: CP
Start: 2017-07-02 — End: ?
  Administered 2017-07-02: 15:00:00 40 meq via ORAL

## 2017-07-02 NOTE — Other
Critical result or procedure called (document test and value, and read back):  aPTT critically high  Time MD/NP Notified:  following heparin protocol  MD/NP Name:  following heparin protocol  MD/NP Response/Orders Given:  following heparin protocol

## 2017-07-02 NOTE — Other
Critical result or procedure called (document test and value, and read back):  aPTT critically high 187.8  Time MD/NP Notified:  following heparin protocol  MD/NP Name:  following heparin protocol  MD/NP Response/Orders Given:  following heparin protocol

## 2017-07-02 NOTE — Progress Notes
Assessments complete and documented per flowsheet, no acute changes noted.   A/Ox4. VSS. Tolerating RA. A Fib on tele.   No skin issues.   Ambulating ad lib.  UOA, BM 12/28.  Call light within reach, will continue to monitor until transferring care to night shift RN.    1000 - moderate chest pain, relieved with x1 dose of nitro  1300 - moderate chest pain, relieved with x2 dose of nitro, team notified    Future chest pain complaints: call if pain unrelieved with 2 doses and nitro gtt will be ordered

## 2017-07-02 NOTE — Progress Notes
General Progress Note    Name:  Karen Horton   RUEAV'W Date:  07/02/2017  Admission Date: 06/30/2017  LOS: 2 days                     Assessment/Plan:    Active Problems:    Essential hypertension    HLD (hyperlipidemia)    Permanent atrial fibrillation (HCC)    Chest pain    Nonsustained ventricular tachycardia [facility])    Patient is a 76 year old female with PMH of permanent atrial fibrillation, HTN, HLD right MCA stroke presenting with chest pain on 12/27.  ???  # Chest Pain  # Unstable Angina  - Reports severe chest pressure at rest. Radiates to L arm. Improved with nitro. 2/2 Unstable angina / Reflux  - EKG with A-fib with rate 80s, Intervals wnl. No ST changes.  - MPI stress 03/16/17: This study is probably normal. ???There is evidence of soft tissue attenuation, a significant ischemic change was not appreciated. ???Left ventricular systolic function is normal, the ejection fraction was 70%. There are no high risk prognostic indicators present. ???The pulmonary to myocardial count ratio is normal at 0.40, there is no transient ischemic dilatation noted. ???The ECG portion of the study is negative for ischemia.  - Echo 04/24/14: There is limited endocardial definition. No regional wall motion abnormalities are seen. Overall LV systolic function appears normal. The estimated left ventricular ejection fraction is 55%. There is mild concentric left ventricular hypertrophy. Right ventricular contractility appears normal. Moderate left atrial enlargement. Mild right atrial enlargement. Mild tricuspid valve regurgitation. No pericardial effusion is seen.  - Trop 0.01 on admission --> 0.01 --> 0.01.  - Chest Xray 12/27 with no consolidation and cardiomegaly.  - Echo 12/28 with moderately dilated LA, mild LVH, LVEF 60%.  - EKG 12/28 am without any ST elevation.     > Continue Trop     > Chest pain episodes while at rest, keep inpatient until [facility] is performed as detailed below               > ASA 81 qd > LHC tentatively 07/04/17 (Initially considered 12/28, but she had taken 12/27 dose of eliquis; will stop further eliquis doses and continue with heparin gtt through heart cath)               > Nitro tab PRN for chest pain, Consider gtt if pain persists     > Repeat EKG PRN for pain  ???  # Permanent Atrial Fibrillation   - PTA Eliquis               > PTA Eliquis on hold with plan for LHC on 07/04/17; continue heparin gtt in interim               > Continue Dilt for rate control as below                 # HTN   - PTA Diltiazem CD 120               > Continue Diltiazem  ???  # HLD  - PTA Simvastatin 10 mg  - Lipid: Cho 206, Tri 89, HDL 69, LDL 131               > Increased to Atorvastatin 80 mg    # GERD    > Pantoprazole QHS    FEN: No IVF, Replace lytes PRN, NPO  PPx: PTA eliquis  changed to heparin in anticipation for LHC  Code: Full   ???  Seen/discussed with Dr. Jeanella Cara, MD  Emergency Medicine-PGY2  Team pager [phone]  ________________________________________________________________________    Subjective  Karen Horton is a 76 y.o. female. Patient endores reflux overnight that improved with medication. She states it is distinctly different from the chest pain causing her to present to the emergency deparment. She denies any shortness of breath, abdominal pain, headaches, or other symptoms not mentioned above. Plan to is perform LHC as soon as able.    Medications  Scheduled Meds:    aspirin EC tablet 81 mg 81 mg Oral QDAY   atorvastatin (LIPITOR) tablet 80 mg 80 mg Oral QDAY   diltiazem CD (cardIZEM CD) capsule 120 mg 120 mg Oral QDAY   heparin (porcine) BOLUS for continuous inf (bag) [phone] Units 20-40 Units/kg Intravenous As Prescribed   pantoprazole(#) (PROTONIX) suspension 40 mg 40 mg Oral QDAY(21)   Continuous Infusions:  ??? heparin (porcine) 20,000 units/D5W 500 mL infusion (std conc)(premade) Stopped (07/02/17 1200) PRN and Respiratory Meds:nitroglycerin Q5 MIN PRN, sodium chloride 0.9% (NS) PRN    Objective:                          Vital Signs: Last Filed                 Vital Signs: 24 Hour Range   BP: 132/86 (12/29 1145)  Temp: 36.7 ???C (98.1 ???F) (12/29 1145)  Pulse: 73 (12/29 1145)  Respirations: 18 PER MINUTE (12/29 1145)  SpO2: 99 % (12/29 1145)  O2 Delivery: None (Room Air) (12/29 1145) BP: (121-143)/(64-90)   Temp:  [36.5 ???C (97.7 ???F)-37.2 ???C (98.9 ???F)]   Pulse:  [64-92]   Respirations:  [16 PER MINUTE-18 PER MINUTE]   SpO2:  [92 %-99 %]   O2 Delivery: None (Room Air)   Intensity Pain Scale (Self Report): Asleep (07/02/17 0300) Vitals:    06/30/17 1516 07/01/17 1206   Weight: 96.6 kg (212 lb 15.4 oz) 96.2 kg (212 lb)       Intake/Output Summary:  (Last 24 hours)    Intake/Output Summary (Last 24 hours) at 07/02/2017 1354  Last data filed at 07/02/2017 1145  Gross per 24 hour   Intake 525 ml   Output 800 ml   Net -275 ml           Physical Exam  General: no acute distress  Eyes: EOMI, non-injected conjunctiva, vision grossly intact  ENT: external ears normal, no nasal drainage, moist mucous membranes  Neck: supple, no JVD, no lymphadenopathy  Respiratory: lungs CTA bilaterally  Cardiovascular: irregularly irregular in 80s, normal S1 and S2, no murmur/click/rub  Abdomen: soft, non-tender, non-distended, active bowel sounds, no masses or organomegaly  Extremities/MSK: normal ROM, no cyanosis or swelling  Neuro: no focal deficits, alert and oriented to person/place/time  Skin: no rash    Lab Review  24-hour labs:    Results for orders placed or performed during the hospital encounter of 06/30/17 (from the past 24 hour(s))   TROPONIN-I    Collection Time: 07/01/17  8:00 PM   Result Value Ref Range    Troponin-I <0.01 0.0 - 0.05 NG/ML   PTT (APTT)    Collection Time: 07/01/17  8:00 PM   Result Value Ref Range    APTT 33.3 24.0 - 36.5 SEC   CBC    Collection Time: 07/01/17  8:00  PM   Result Value Ref Range White Blood Cells 5.7 4.5 - 11.0 K/UL    RBC 4.12 4.0 - 5.0 M/UL    Hemoglobin 13.3 12.0 - 15.0 GM/DL    Hematocrit 45.4 36 - 45 %    MCV 96.7 80 - 100 FL    MCH 32.4 26 - 34 PG    MCHC 33.5 32.0 - 36.0 G/DL    RDW 09.8 11 - 15 %    Platelet Count 251 150 - 400 K/UL    MPV 7.7 7 - 11 FL   TROPONIN-I    Collection Time: 07/02/17  3:00 AM   Result Value Ref Range    Troponin-I <0.01 0.0 - 0.05 NG/ML   PTT (APTT)    Collection Time: 07/02/17  3:00 AM   Result Value Ref Range    APTT 196.5 (HH) 24.0 - 36.5 SEC   COMPREHENSIVE METABOLIC PANEL    Collection Time: 07/02/17  3:00 AM   Result Value Ref Range    Sodium 137 137 - 147 MMOL/L    Potassium 3.6 3.5 - 5.1 MMOL/L    Chloride 104 98 - 110 MMOL/L    Glucose 110 (H) 70 - 100 MG/DL    Blood Urea Nitrogen 21 7 - 25 MG/DL    Creatinine 1.19 0.4 - 1.00 MG/DL    Calcium 9.3 8.5 - 14.7 MG/DL    Total Protein 6.2 6.0 - 8.0 G/DL    Total Bilirubin 0.5 0.3 - 1.2 MG/DL    Albumin 3.6 3.5 - 5.0 G/DL    Alk Phosphatase 71 25 - 110 U/L    AST (SGOT) 12 7 - 40 U/L    CO2 25 21 - 30 MMOL/L    ALT (SGPT) 9 7 - 56 U/L    Anion Gap 8 3 - 12    eGFR Non African American >60 >60 mL/min    eGFR African American >60 >60 mL/min   CBC AND DIFF    Collection Time: 07/02/17  3:00 AM   Result Value Ref Range    White Blood Cells 5.5 4.5 - 11.0 K/UL    RBC 3.95 (L) 4.0 - 5.0 M/UL    Hemoglobin 12.7 12.0 - 15.0 GM/DL    Hematocrit 82.9 36 - 45 %    MCV 96.6 80 - 100 FL    MCH 32.1 26 - 34 PG    MCHC 33.3 32.0 - 36.0 G/DL    RDW 56.2 11 - 15 %    Platelet Count 236 150 - 400 K/UL    MPV 8.2 7 - 11 FL    Neutrophils 64 41 - 77 %    Lymphocytes 19 (L) 24 - 44 %    Monocytes 12 4 - 12 %    Eosinophils 4 0 - 5 %    Basophils 1 0 - 2 %    Absolute Neutrophil Count 3.60 1.8 - 7.0 K/UL    Absolute Lymph Count 1.00 1.0 - 4.8 K/UL    Absolute Monocyte Count 0.70 0 - 0.80 K/UL    Absolute Eosinophil Count 0.20 0 - 0.45 K/UL    Absolute Basophil Count 0.00 0 - 0.20 K/UL   MAGNESIUM Collection Time: 07/02/17  3:00 AM   Result Value Ref Range    Magnesium 2.2 1.6 - 2.6 mg/dL   TROPONIN-I    Collection Time: 07/02/17  7:57 AM   Result Value Ref Range    Troponin-I <0.01 0.0 - 0.05  NG/ML   TROPONIN-I    Collection Time: 07/02/17 11:26 AM   Result Value Ref Range    Troponin-I <0.01 0.0 - 0.05 NG/ML   PTT (APTT)    Collection Time: 07/02/17 12:20 PM   Result Value Ref Range    APTT 187.8 (HH) 24.0 - 36.5 SEC       Point of Care Testing  (Last 24 hours)  Glucose: (!) 110 (07/02/17 0300)    Radiology and other Diagnostics Review:    Pertinent radiology reviewed.    Paulla Dolly, MD   [phone]

## 2017-07-03 LAB — CBC AND DIFF: Lab: 5.1 10*3/uL — ABNORMAL LOW (ref 4.5–11.0)

## 2017-07-03 LAB — PTT (APTT)
Lab: 75 s — ABNORMAL HIGH (ref 24.0–36.5)
Lab: 72 s — ABNORMAL HIGH (ref 24.0–36.5)
Lab: 85 s — ABNORMAL HIGH (ref 24.0–36.5)

## 2017-07-03 LAB — COMPREHENSIVE METABOLIC PANEL: Lab: 138 MMOL/L (ref >60)

## 2017-07-03 LAB — MAGNESIUM: Lab: 2 mg/dL (ref 1.6–2.6)

## 2017-07-03 MED ORDER — SODIUM CHLORIDE 0.9 % IV SOLP
250 mL | INTRAVENOUS | 0 refills | Status: AC | PRN
Start: 2017-07-03 — End: ?

## 2017-07-03 MED ORDER — SODIUM CHLORIDE 0.9 % IV SOLP
1000 mL | Freq: Once | INTRAVENOUS | 0 refills | Status: CP
Start: 2017-07-03 — End: ?
  Administered 2017-07-04: 07:00:00 1000 mL via INTRAVENOUS

## 2017-07-03 NOTE — Progress Notes
Assumed pt care at 1900. A-Fib on telemetry. VSS per trend, see doc flow. 4/10 chest pain reported. Pain improved with Nitro x1. Pt on Hep gtts. No other events at night. Call light within reach. No further needs at this time. Will continue to monitor.

## 2017-07-03 NOTE — Progress Notes
General Progress Note    Name:  Karen Horton   WGNFA'O Date:  07/03/2017  Admission Date: 06/30/2017  LOS: 3 days                     Assessment/Plan:    Active Problems:    Essential hypertension    HLD (hyperlipidemia)    Permanent atrial fibrillation (HCC)    Chest pain    Nonsustained ventricular tachycardia [facility])    Patient is a 76 year old female with PMH of permanent atrial fibrillation, HTN, HLD right MCA stroke presenting with chest pain on 12/27.  ???  # Chest Pain  # Unstable Angina  - Reports severe chest pressure at rest. Radiates to L arm. Improved with nitro. 2/2 Unstable angina / Reflux  - EKG with A-fib with rate 80s, Intervals wnl. No ST changes.  - MPI stress 03/16/17: This study is probably normal. ???There is evidence of soft tissue attenuation, a significant ischemic change was not appreciated. ???Left ventricular systolic function is normal, the ejection fraction was 70%. There are no high risk prognostic indicators present. ???The pulmonary to myocardial count ratio is normal at 0.40, there is no transient ischemic dilatation noted. ???The ECG portion of the study is negative for ischemia.  - Echo 04/24/14: There is limited endocardial definition. No regional wall motion abnormalities are seen. Overall LV systolic function appears normal. The estimated left ventricular ejection fraction is 55%. There is mild concentric left ventricular hypertrophy. Right ventricular contractility appears normal. Moderate left atrial enlargement. Mild right atrial enlargement. Mild tricuspid valve regurgitation. No pericardial effusion is seen.  - Trop 0.01 on admission --> 0.01 --> 0.01.  - Chest Xray 12/27 with no consolidation and cardiomegaly.  - Echo 12/28 with moderately dilated LA, mild LVH, LVEF 60%.  - EKG 12/28 am without any ST elevation. Repeat EKG 12/29 with no acute changes. Patient's pain resolved after 2 sublingual nitroglycerin. > Chest pain episodes while at rest, keep inpatient until [facility] is performed as detailed below               > ASA 81 qd               > LHC tentatively 07/04/17 (Initially considered 12/28, but she had taken 12/27 dose of eliquis; will stop further eliquis doses and continue with heparin gtt through heart cath)   > Will order Foley placement 12/31am for day of cath (given she'll likely be bed bound shortly after procedure and she has urinary urgency symptoms at baseline)               > Nitro tab PRN for chest pain, Consider gtt if pain persists after 1 nitro tab     > Repeat EKG PRN for pain  ???  # Permanent Atrial Fibrillation   - PTA Eliquis               > PTA Eliquis on hold with plan for LHC on 07/04/17; continue heparin gtt in interim               > Continue Dilt for rate control as below                 # HTN   - PTA Diltiazem CD 120               > Continue Diltiazem  ???  # HLD  - PTA Simvastatin 10 mg  - Lipid: Cho 206, Tri  89, HDL 69, LDL 131               > Increased to Atorvastatin 80 mg    # GERD    > Pantoprazole QHS    FEN: No IVF, Replace lytes PRN, NPO @ MN  PPx: PTA eliquis changed to heparin in anticipation for LHC  Code: Full   ???  Seen/discussed with Dr. Pierre Bali  Marlane Mingle, MD, IM-PGY2  [phone], CV1 team pager [phone]  ________________________________________________________________________    Subjective  Karen Horton is a 76 y.o. female. Noted one episode of achiness in her chest last evening. Resolved after 1 sublingual nitro. Slept well overnight. Hasn't had any recurrent chest pain episodes. Requests foley placement tom morning as she will be bed bound after cath and has urinary urgency symptoms at baseline. Denies any new concerns.     Medications  Scheduled Meds:    aspirin EC tablet 81 mg 81 mg Oral QDAY   atorvastatin (LIPITOR) tablet 80 mg 80 mg Oral QDAY   diltiazem CD (cardIZEM CD) capsule 120 mg 120 mg Oral QDAY heparin (porcine) BOLUS for continuous inf (bag) [phone] Units 20-40 Units/kg Intravenous As Prescribed   pantoprazole(#) (PROTONIX) suspension 40 mg 40 mg Oral QDAY(21)   Continuous Infusions:  ??? heparin (porcine) 20,000 units/D5W 500 mL infusion (std conc)(premade) 1,143 Units/hr (07/03/17 0553)     PRN and Respiratory Meds:nitroglycerin Q5 MIN PRN    Objective:                          Vital Signs: Last Filed                 Vital Signs: 24 Hour Range   BP: 140/70 (12/30 0300)  Temp: 36.6 ???C (97.8 ???F) (12/30 0300)  Pulse: 74 (12/30 0300)  Respirations: 16 PER MINUTE (12/30 0300)  SpO2: 92 % (12/30 0300)  O2 Delivery: None (Room Air) (12/30 0300) BP: (125-140)/(70-94)   Temp:  [36.5 ???C (97.7 ???F)-36.8 ???C (98.3 ???F)]   Pulse:  [52-86]   Respirations:  [16 PER MINUTE-18 PER MINUTE]   SpO2:  [92 %-99 %]   O2 Delivery: None (Room Air)   Intensity Pain Scale (Self Report): 4 (07/02/17 2116) Vitals:    06/30/17 1516 07/01/17 1206   Weight: 96.6 kg (212 lb 15.4 oz) 96.2 kg (212 lb)       Intake/Output Summary:  (Last 24 hours)    Intake/Output Summary (Last 24 hours) at 07/03/2017 0608  Last data filed at 07/02/2017 2030  Gross per 24 hour   Intake 500 ml   Output 800 ml   Net -300 ml           Physical Exam  General: no acute distress  Eyes: EOMI, non-injected conjunctiva, vision grossly intact  ENT: external ears normal, no nasal drainage, moist mucous membranes  Neck: supple, no JVD, no lymphadenopathy  Respiratory: lungs CTA bilaterally  Cardiovascular: irregularly irregular in 80s, normal S1 and S2, no murmur/click/rub  Abdomen: soft, non-tender, non-distended, active bowel sounds, no masses or organomegaly  Extremities/MSK: normal ROM, no cyanosis or swelling  Neuro: no focal deficits, alert and oriented to person/place/time  Skin: no rash    Lab Review  24-hour labs:    Results for orders placed or performed during the hospital encounter of 06/30/17 (from the past 24 hour(s))   TROPONIN-I Collection Time: 07/02/17  7:57 AM   Result Value Ref Range  Troponin-I <0.01 0.0 - 0.05 NG/ML   TROPONIN-I    Collection Time: 07/02/17 11:26 AM   Result Value Ref Range    Troponin-I <0.01 0.0 - 0.05 NG/ML   PTT (APTT)    Collection Time: 07/02/17 12:20 PM   Result Value Ref Range    APTT 187.8 (HH) 24.0 - 36.5 SEC   PTT (APTT)    Collection Time: 07/02/17  2:40 PM   Result Value Ref Range    APTT 63.5 (H) 24.0 - 36.5 SEC   PTT (APTT)    Collection Time: 07/02/17  9:28 PM   Result Value Ref Range    APTT 75.8 (H) 24.0 - 36.5 SEC   COMPREHENSIVE METABOLIC PANEL    Collection Time: 07/03/17  5:00 AM   Result Value Ref Range    Sodium 138 137 - 147 MMOL/L    Potassium 4.0 3.5 - 5.1 MMOL/L    Chloride 105 98 - 110 MMOL/L    Glucose 101 (H) 70 - 100 MG/DL    Blood Urea Nitrogen 15 7 - 25 MG/DL    Creatinine 1.61 0.4 - 1.00 MG/DL    Calcium 9.1 8.5 - 09.6 MG/DL    Total Protein 6.4 6.0 - 8.0 G/DL    Total Bilirubin 0.4 0.3 - 1.2 MG/DL    Albumin 3.7 3.5 - 5.0 G/DL    Alk Phosphatase 64 25 - 110 U/L    AST (SGOT) 12 7 - 40 U/L    CO2 26 21 - 30 MMOL/L    ALT (SGPT) 10 7 - 56 U/L    Anion Gap 7 3 - 12    eGFR Non African American >60 >60 mL/min    eGFR African American >60 >60 mL/min   MAGNESIUM    Collection Time: 07/03/17  5:00 AM   Result Value Ref Range    Magnesium 2.0 1.6 - 2.6 mg/dL   PTT (APTT)    Collection Time: 07/03/17  5:00 AM   Result Value Ref Range    APTT 72.7 (H) 24.0 - 36.5 SEC       Point of Care Testing  (Last 24 hours)  Glucose: (!) 101 (07/03/17 0500)    Radiology and other Diagnostics Review:    Pertinent radiology reviewed.    Marlane Mingle, MD

## 2017-07-04 ENCOUNTER — Encounter: Admit: 2017-07-04 | Discharge: 2017-07-04 | Payer: MEDICARE

## 2017-07-04 DIAGNOSIS — E785 Hyperlipidemia, unspecified: ICD-10-CM

## 2017-07-04 DIAGNOSIS — Z8673 Personal history of transient ischemic attack (TIA), and cerebral infarction without residual deficits: ICD-10-CM

## 2017-07-04 DIAGNOSIS — I10 Essential (primary) hypertension: ICD-10-CM

## 2017-07-04 LAB — CBC
Lab: 13 % (ref 11–15)
Lab: 226 K/UL (ref 150–400)
Lab: 3.8 M/UL — ABNORMAL LOW (ref 4.0–5.0)
Lab: 32 pg (ref 26–34)
Lab: 33 g/dL (ref 32.0–36.0)
Lab: 37 % (ref >60)
Lab: 6 K/UL (ref 4.5–11.0)
Lab: 7.9 FL (ref 7–11)
Lab: 96 FL (ref 80–100)

## 2017-07-04 LAB — MAGNESIUM: Lab: 2 mg/dL (ref >60)

## 2017-07-04 LAB — COMPREHENSIVE METABOLIC PANEL
Lab: 11 U/L (ref 7–56)
Lab: 136 MMOL/L — ABNORMAL LOW (ref 137–147)
Lab: 27 MMOL/L (ref 21–30)
Lab: 3.7 MMOL/L — ABNORMAL HIGH (ref 3.5–5.1)
Lab: 5 (ref 3–12)
Lab: >60 mL/min (ref >60)

## 2017-07-04 LAB — PTT (APTT)
Lab: 65 s — ABNORMAL HIGH (ref 24.0–36.5)
Lab: 120 s — ABNORMAL HIGH (ref 24.0–36.5)
Lab: 71 s — ABNORMAL HIGH (ref 24.0–36.5)

## 2017-07-04 MED ORDER — ALUMINUM-MAGNESIUM HYDROXIDE 200-200 MG/5 ML PO SUSP
30 mL | ORAL | 0 refills | Status: DC | PRN
Start: 2017-07-04 — End: 2017-07-05

## 2017-07-04 MED ORDER — ONDANSETRON HCL (PF) 4 MG/2 ML IJ SOLN
4 mg | INTRAVENOUS | 0 refills | Status: DC | PRN
Start: 2017-07-04 — End: 2017-07-05

## 2017-07-04 MED ORDER — DIPHENHYDRAMINE HCL 50 MG/ML IJ SOLN
25 mg | INTRAVENOUS | 0 refills | Status: DC | PRN
Start: 2017-07-04 — End: 2017-07-05

## 2017-07-04 MED ORDER — DIPHENHYDRAMINE HCL 25 MG PO CAP
25 mg | ORAL | 0 refills | Status: DC | PRN
Start: 2017-07-04 — End: 2017-07-05

## 2017-07-04 MED ORDER — APIXABAN 5 MG PO TAB
5 mg | Freq: Two times a day (BID) | ORAL | 0 refills | Status: DC
Start: 2017-07-04 — End: 2017-07-05
  Administered 2017-07-05 (×2): 5 mg via ORAL

## 2017-07-04 MED ORDER — ACETAMINOPHEN 325 MG PO TAB
650 mg | ORAL | 0 refills | Status: DC | PRN
Start: 2017-07-04 — End: 2017-07-05
  Administered 2017-07-05: 14:00:00 650 mg via ORAL

## 2017-07-04 MED ORDER — ASPIRIN 81 MG PO CHEW
243 mg | Freq: Once | ORAL | 0 refills | Status: CP
Start: 2017-07-04 — End: ?
  Administered 2017-07-04: 17:00:00 243 mg via ORAL

## 2017-07-04 MED ORDER — IMS MIXTURE TEMPLATE
30 meq | Freq: Once | ORAL | 0 refills | Status: CP
Start: 2017-07-04 — End: ?
  Administered 2017-07-04 (×2): 30 meq via ORAL

## 2017-07-04 NOTE — Progress Notes
[facility] Patient transported to [facility] with RN. Puncture wound has no bleeding and no hematoma. Vital signs are stable.

## 2017-07-04 NOTE — Progress Notes
General Progress Note    Name:  Karen Horton   GNFAO'Z Date:  07/04/2017  Admission Date: 06/30/2017  LOS: 4 days                     Assessment/Plan:    Active Problems:    Essential hypertension    HLD (hyperlipidemia)    Permanent atrial fibrillation (HCC)    Chest pain    Nonsustained ventricular tachycardia [facility])    Patient is a 76 year old female with PMH of permanent atrial fibrillation, HTN, HLD right MCA stroke presenting with chest pain on 12/27.  ???  # Chest Pain  # Unstable Angina  - Reports severe chest pressure at rest. Radiates to L arm. Improved with nitro. 2/2 Unstable angina / Reflux  - EKG with A-fib with rate 80s, Intervals wnl. No ST changes.  - MPI stress 03/16/17: This study is probably normal. ???There is evidence of soft tissue attenuation, a significant ischemic change was not appreciated. ???Left ventricular systolic function is normal, the ejection fraction was 70%. There are no high risk prognostic indicators present. ???The pulmonary to myocardial count ratio is normal at 0.40, there is no transient ischemic dilatation noted. ???The ECG portion of the study is negative for ischemia.  - Echo 04/24/14: There is limited endocardial definition. No regional wall motion abnormalities are seen. Overall LV systolic function appears normal. The estimated left ventricular ejection fraction is 55%. There is mild concentric left ventricular hypertrophy. Right ventricular contractility appears normal. Moderate left atrial enlargement. Mild right atrial enlargement. Mild tricuspid valve regurgitation. No pericardial effusion is seen.  - Trop 0.01 on admission --> 0.01 --> 0.01.  - Chest Xray 12/27 with no consolidation and cardiomegaly.  - Echo 12/28 with moderately dilated LA, mild LVH, LVEF 60%.  - EKG 12/28 am without any ST elevation. Repeat EKG 12/29 with no acute changes. Patient's pain resolved after 2 sublingual nitroglycerin. > Chest pain episodes while at rest, keep inpatient until [facility] is performed as detailed below               > ASA 81 qd               > LHC tentatively today cath delayed for anticoagulation to be held   > Foley placement prior to procedure due to  (given she'll likely be bed bound shortly after procedure and she has urinary urgency symptoms at baseline)               > Nitro tab PRN for chest pain, Consider gtt if pain persists after 1 nitro tab     > Repeat EKG PRN for pain  ???  # Permanent Atrial Fibrillation   - PTA Eliquis               > PTA Eliquis on hold with plan for LHC on 07/04/17; continue heparin gtt in interim               > Continue Dilt for rate control as below                 # HTN   - PTA Diltiazem CD 120               > Continue Diltiazem  ???  # HLD  - PTA Simvastatin 10 mg  - Lipid: Cho 206, Tri 89, HDL 69, LDL 131               >  Increased to Atorvastatin 80 mg    # GERD    > Pantoprazole QHS    FEN: No IVF, Replace lytes PRN, NPO @ MN  PPx: PTA eliquis changed to heparin in anticipation for LHC  Code: Full   ???  Seen/discussed with Dr. Lance Muss, MD  PGY2 Emergency Medicine  [phone], CV1 team pager [phone]  ________________________________________________________________________    Subjective  Karen Horton is a 76 y.o. female.  Patient endorses single episode of chest pain that resolved after 1 tab of nitro.  She states sleeping has been difficult in the hospital with frequent vitals and reassessment checks.  She denies any chest pain currently.  She denies any shortness of breath, leg swelling, abdominal pain, or other symptoms not mentioned above.  She understands the cath will be performed today.  She denies any issues with contrast despite her shellfish allergy over a decade ago.       Medications  Scheduled Meds:    aspirin EC tablet 81 mg 81 mg Oral QDAY   atorvastatin (LIPITOR) tablet 80 mg 80 mg Oral QDAY   diltiazem CD (cardIZEM CD) capsule 120 mg 120 mg Oral QDAY heparin (porcine) BOLUS for continuous inf (bag) [phone] Units 20-40 Units/kg Intravenous As Prescribed   pantoprazole(#) (PROTONIX) suspension 40 mg 40 mg Oral QDAY(21)   Continuous Infusions:  ??? heparin (porcine) 20,000 units/D5W 500 mL infusion (std conc)(premade) 1,143 Units/hr (07/04/17 0404)     PRN and Respiratory Meds:nitroglycerin Q5 MIN PRN, sodium chloride 0.9% (NS) PRN    Objective:                          Vital Signs: Last Filed                 Vital Signs: 24 Hour Range   BP: 128/76 (12/31 0235)  Temp: 36.5 ???C (97.7 ???F) (12/31 0235)  Pulse: 81 (12/30 2259)  Respirations: 18 PER MINUTE (12/31 0235)  SpO2: 96 % (12/31 0235)  O2 Delivery: None (Room Air) (12/31 0235) BP: (114-142)/(58-87)   Temp:  [36.4 ???C (97.5 ???F)-36.7 ???C (98.1 ???F)]   Pulse:  [61-81]   Respirations:  [18 PER MINUTE]   SpO2:  [94 %-97 %]   O2 Delivery: None (Room Air)     Vitals:    06/30/17 1516 07/01/17 1206   Weight: 96.6 kg (212 lb 15.4 oz) 96.2 kg (212 lb)       Intake/Output Summary:  (Last 24 hours)    Intake/Output Summary (Last 24 hours) at 07/04/2017 1478  Last data filed at 07/04/2017 0236  Gross per 24 hour   Intake 977 ml   Output 1550 ml   Net -573 ml           Physical Exam  General: no acute distress  Eyes: EOMI, non-injected conjunctiva, vision grossly intact  ENT: external ears normal, no nasal drainage, moist mucous membranes  Neck: supple, no JVD, no lymphadenopathy  Respiratory: lungs CTA bilaterally  Cardiovascular: irregularly irregular in 80s, normal S1 and S2, no murmur/click/rub  Abdomen: soft, non-tender, non-distended, active bowel sounds, no masses or organomegaly  Extremities/MSK: normal ROM, no cyanosis or swelling  Neuro: no focal deficits, alert and oriented to person/place/time  Skin: no rash    Lab Review  24-hour labs:    Results for orders placed or performed during the hospital encounter of 06/30/17 (from the past 24 hour(s))   PTT (APTT)  Collection Time: 07/03/17 11:55 AM Result Value Ref Range    APTT 85.6 (H) 24.0 - 36.5 SEC   PTT (APTT)    Collection Time: 07/03/17  6:12 PM   Result Value Ref Range    APTT 65.5 (H) 24.0 - 36.5 SEC   PTT (APTT)    Collection Time: 07/04/17  2:33 AM   Result Value Ref Range    APTT 120.4 (H) 24.0 - 36.5 SEC   COMPREHENSIVE METABOLIC PANEL    Collection Time: 07/04/17  2:33 AM   Result Value Ref Range    Sodium 136 (L) 137 - 147 MMOL/L    Potassium 3.7 3.5 - 5.1 MMOL/L    Chloride 104 98 - 110 MMOL/L    Glucose 105 (H) 70 - 100 MG/DL    Blood Urea Nitrogen 16 7 - 25 MG/DL    Creatinine 9.14 0.4 - 1.00 MG/DL    Calcium 9.2 8.5 - 78.2 MG/DL    Total Protein 6.4 6.0 - 8.0 G/DL    Total Bilirubin 0.4 0.3 - 1.2 MG/DL    Albumin 3.7 3.5 - 5.0 G/DL    Alk Phosphatase 62 25 - 110 U/L    AST (SGOT) 12 7 - 40 U/L    CO2 27 21 - 30 MMOL/L    ALT (SGPT) 11 7 - 56 U/L    Anion Gap 5 3 - 12    eGFR Non African American >60 >60 mL/min    eGFR African American >60 >60 mL/min   CBC    Collection Time: 07/04/17  2:33 AM   Result Value Ref Range    White Blood Cells 6.0 4.5 - 11.0 K/UL    RBC 3.87 (L) 4.0 - 5.0 M/UL    Hemoglobin 12.7 12.0 - 15.0 GM/DL    Hematocrit 95.6 36 - 45 %    MCV 96.8 80 - 100 FL    MCH 32.7 26 - 34 PG    MCHC 33.8 32.0 - 36.0 G/DL    RDW 21.3 11 - 15 %    Platelet Count 226 150 - 400 K/UL    MPV 7.9 7 - 11 FL   MAGNESIUM    Collection Time: 07/04/17  2:33 AM   Result Value Ref Range    Magnesium 2.0 1.6 - 2.6 mg/dL       Point of Care Testing  (Last 24 hours)  Glucose: (!) 105 (07/04/17 0233)    Radiology and other Diagnostics Review:    Pertinent radiology reviewed.    Paulla Dolly, MD

## 2017-07-04 NOTE — Progress Notes
Assumed pt care at 1900. A-Fib on telemetry. VSS per trend, see doc flow. 3/10 chest pain reported. Pain improved with Nitro x1. Pt on Hep gtts. No other events at night. NPO since MN. Pre-op checklist done. Call light within reach. No further needs at this time. Will continue to monitor

## 2017-07-05 ENCOUNTER — Encounter: Admit: 2017-07-05 | Discharge: 2017-07-05 | Payer: MEDICARE

## 2017-07-05 ENCOUNTER — Inpatient Hospital Stay: Admit: 2017-06-30 | Discharge: 2017-06-30 | Payer: MEDICARE

## 2017-07-05 DIAGNOSIS — I361 Nonrheumatic tricuspid (valve) insufficiency: ICD-10-CM

## 2017-07-05 DIAGNOSIS — R0789 Other chest pain: Secondary | ICD-10-CM

## 2017-07-05 DIAGNOSIS — I482 Chronic atrial fibrillation: Principal | ICD-10-CM

## 2017-07-05 DIAGNOSIS — E785 Hyperlipidemia, unspecified: ICD-10-CM

## 2017-07-05 DIAGNOSIS — Z79899 Other long term (current) drug therapy: ICD-10-CM

## 2017-07-05 DIAGNOSIS — I10 Essential (primary) hypertension: ICD-10-CM

## 2017-07-05 DIAGNOSIS — Z8673 Personal history of transient ischemic attack (TIA), and cerebral infarction without residual deficits: ICD-10-CM

## 2017-07-05 DIAGNOSIS — Z87891 Personal history of nicotine dependence: ICD-10-CM

## 2017-07-05 DIAGNOSIS — K219 Gastro-esophageal reflux disease without esophagitis: ICD-10-CM

## 2017-07-05 LAB — COMPREHENSIVE METABOLIC PANEL: Lab: 138 MMOL/L — ABNORMAL LOW (ref >60)

## 2017-07-05 LAB — MAGNESIUM: Lab: 2.2 mg/dL — ABNORMAL LOW (ref 1.6–2.6)

## 2017-07-05 LAB — CBC: Lab: 4.8 K/UL — ABNORMAL LOW (ref 4.5–11.0)

## 2017-07-05 MED ORDER — PANTOPRAZOLE 20 MG PO TBEC
20 mg | ORAL_TABLET | Freq: Every day | ORAL | 2 refills | 90.00000 days | Status: AC
Start: 2017-07-05 — End: ?
  Filled 2017-07-05 (×2): qty 20, 20d supply, fill #1

## 2017-07-05 MED ORDER — SIMVASTATIN 20 MG PO TAB
20 mg | ORAL_TABLET | Freq: Every evening | ORAL | 3 refills | Status: AC
Start: 2017-07-05 — End: 2017-07-05

## 2017-07-05 MED ORDER — SIMVASTATIN 10 MG PO TAB
10 mg | ORAL_TABLET | Freq: Every evening | ORAL | 3 refills | Status: AC
Start: 2017-07-05 — End: 2020-02-07
  Filled 2017-07-05 (×2): qty 90, 90d supply, fill #1

## 2017-07-05 NOTE — Progress Notes
Assumed pt care at 1900. A-Fibon telemetry. VSS per trend, see doc flow. Nopain reported.  Rt hand with splint on. No events at night. Call light within reach.No further needs at this time. Will continue to monitor

## 2017-07-05 NOTE — Progress Notes
General Progress Note    Name:  Karen Horton   UUVOZ'D Date:  07/05/2017  Admission Date: 06/30/2017  LOS: 5 days                     Assessment/Plan:    Active Problems:    Essential hypertension    HLD (hyperlipidemia)    History of right MCA stroke    Permanent atrial fibrillation (HCC)    Chest pain    Nonsustained ventricular tachycardia [facility])    Patient is a 78 year old female with PMH of permanent atrial fibrillation, HTN, HLD right MCA stroke presenting with chest pain on 12/27.  ???  # Chest Pain  # Unstable Angina  - Reports severe chest pressure at rest. Radiates to L arm. Improved with nitro. 2/2 Unstable angina / Reflux  - EKG with A-fib with rate 80s, Intervals wnl. No ST changes.  - MPI stress 03/16/17: This study is probably normal. ???There is evidence of soft tissue attenuation, a significant ischemic change was not appreciated. ???Left ventricular systolic function is normal, the ejection fraction was 70%. There are no high risk prognostic indicators present. ???The pulmonary to myocardial count ratio is normal at 0.40, there is no transient ischemic dilatation noted. ???The ECG portion of the study is negative for ischemia.  - Echo 04/24/14: There is limited endocardial definition. No regional wall motion abnormalities are seen. Overall LV systolic function appears normal. The estimated left ventricular ejection fraction is 55%. There is mild concentric left ventricular hypertrophy. Right ventricular contractility appears normal. Moderate left atrial enlargement. Mild right atrial enlargement. Mild tricuspid valve regurgitation. No pericardial effusion is seen.  - Trop 0.01 on admission --> 0.01 --> 0.01.  - Chest Xray 12/27 with no consolidation and cardiomegaly.  - Echo 12/28 with moderately dilated LA, mild LVH, LVEF 60%.  - EKG 12/28 am without any ST elevation. Repeat EKG 12/29 with no acute changes. Patient's pain resolved after 2 sublingual nitroglycerin.  -LHC: 12/31normal > can d/c ASA 81 qd as LHC normal               > Nitro tab PRN for chest pain, Consider gtt if pain persists after 1 nitro tab     > Repeat EKG PRN for pain  ???  # Permanent Atrial Fibrillation   - PTA Eliquis               > resume PTA Eliquis now that LHC completed, d/c heparin               > Continue Dilt for rate control as below                 # HTN   - PTA Diltiazem CD 120               > Continue Diltiazem  ???  # HLD  - PTA Simvastatin 10 mg  - Lipid: Cho 206, Tri 89, HDL 69, LDL 131               > Increased to Atorvastatin 80 mg   >will discharge on simvastatin 20mg     # GERD    > Pantoprazole QHS, will prescribe on d/c    FEN: No IVF, Replace lytes PRN, cardiac diet  PPx: PTA eliquis  Code: Full     Dispo: anticipate d/c today  ???  Seen/discussed with Dr. Edwinna Areola, MD  PGY2 Emergency Medicine  CV1 team pager [phone]  ________________________________________________________________________    Subjective  Karen Horton is a 77 y.o. female.  Patient states she slept well, no complaints. No further episodes of chest pain or palpitations. She denies any shortness of breath, leg swelling, abdominal pain, or other symptoms not mentioned above.  She is glad the cath was negative and is happy to be able to go home today.       Medications  Scheduled Meds:    apixaban (ELIQUIS) tablet 5 mg 5 mg Oral BID   atorvastatin (LIPITOR) tablet 80 mg 80 mg Oral QDAY   diltiazem CD (cardIZEM CD) capsule 120 mg 120 mg Oral QDAY   pantoprazole(#) (PROTONIX) suspension 40 mg 40 mg Oral QDAY(21)   Continuous Infusions:    PRN and Respiratory Meds:acetaminophen Q4H PRN, aluminum/magnesium hydroxide Q4H PRN, diphenhydrAMINE Q4H PRN **OR** diphenhydrAMINE Q4H PRN, nitroglycerin Q5 MIN PRN, ondansetron (ZOFRAN) IV Q6H PRN    Objective:                          Vital Signs: Last Filed                 Vital Signs: 24 Hour Range   BP: 119/79 (01/01 0744)  Temp: 36.3 ???C (97.4 ???F) (01/01 0744) Pulse: 77 (01/01 0803)  Respirations: 18 PER MINUTE (01/01 0744)  SpO2: 95 % (01/01 0744)  O2 Delivery: None (Room Air) (01/01 0744)  SpO2 Pulse: 71 (12/31 1530) BP: (98-146)/(64-119)   Temp:  [36.3 ???C (97.4 ???F)-36.7 ???C (98.1 ???F)]   Pulse:  [52-97]   Respirations:  [15 PER MINUTE-26 PER MINUTE]   SpO2:  [90 %-97 %]   O2 Delivery: None (Room Air)     Vitals:    06/30/17 1516 07/01/17 1206   Weight: 96.6 kg (212 lb 15.4 oz) 96.2 kg (212 lb)       Intake/Output Summary:  (Last 24 hours)    Intake/Output Summary (Last 24 hours) at 07/05/2017 0839  Last data filed at 07/05/2017 0813  Gross per 24 hour   Intake 850 ml   Output 700 ml   Net 150 ml           Physical Exam  General: no acute distress  Eyes: EOMI, non-injected conjunctiva, vision grossly intact  ENT: external ears normal, no nasal drainage, moist mucous membranes  Neck: supple, no JVD, no lymphadenopathy  Respiratory: lungs CTA bilaterally  Cardiovascular: irregularly irregular in 80s, normal S1 and S2, no murmur/click/rub  Abdomen: soft, non-tender, non-distended, active bowel sounds, no masses or organomegaly  Extremities/MSK: normal ROM, no cyanosis or swelling  Neuro: no focal deficits, alert and oriented to person/place/time  Skin: no rash    Lab Review  24-hour labs:    Results for orders placed or performed during the hospital encounter of 06/30/17 (from the past 24 hour(s))   PTT (APTT)    Collection Time: 07/04/17 10:15 AM   Result Value Ref Range    APTT 71.6 (H) 24.0 - 36.5 SEC   CBC    Collection Time: 07/05/17  4:13 AM   Result Value Ref Range    White Blood Cells 4.8 4.5 - 11.0 K/UL    RBC 4.00 4.0 - 5.0 M/UL    Hemoglobin 13.0 12.0 - 15.0 GM/DL    Hematocrit 91.4 36 - 45 %    MCV 96.5 80 - 100 FL    MCH 32.5 26 - 34 PG  MCHC 33.7 32.0 - 36.0 G/DL    RDW 16.1 11 - 15 %    Platelet Count 212 150 - 400 K/UL    MPV 8.0 7 - 11 FL   COMPREHENSIVE METABOLIC PANEL    Collection Time: 07/05/17  4:13 AM   Result Value Ref Range Sodium 138 137 - 147 MMOL/L    Potassium 4.2 3.5 - 5.1 MMOL/L    Chloride 105 98 - 110 MMOL/L    Glucose 94 70 - 100 MG/DL    Blood Urea Nitrogen 15 7 - 25 MG/DL    Creatinine 0.96 0.4 - 1.00 MG/DL    Calcium 9.4 8.5 - 04.5 MG/DL    Total Protein 6.3 6.0 - 8.0 G/DL    Total Bilirubin 0.4 0.3 - 1.2 MG/DL    Albumin 3.7 3.5 - 5.0 G/DL    Alk Phosphatase 68 25 - 110 U/L    AST (SGOT) 21 7 - 40 U/L    CO2 26 21 - 30 MMOL/L    ALT (SGPT) 15 7 - 56 U/L    Anion Gap 7 3 - 12    eGFR Non African American >60 >60 mL/min    eGFR African American >60 >60 mL/min   MAGNESIUM    Collection Time: 07/05/17  4:13 AM   Result Value Ref Range    Magnesium 2.2 1.6 - 2.6 mg/dL       Point of Care Testing  (Last 24 hours)  Glucose: 94 (07/05/17 0413)    Radiology and other Diagnostics Review:    Pertinent radiology reviewed.    Alinda Dooms, MD

## 2017-07-06 LAB — POC ACTIVATED CLOTTING TIME: Lab: 171 s (ref 7–40)

## 2017-07-06 NOTE — Discharge Instructions - Pharmacy
Physician Discharge Summary      Name: Karen Horton  Medical Record Number: [id number]        Account Number:  [id number]  Date Of Birth:  1940/11/27                         Age:  77 years   Admit date:  06/30/2017                     Discharge date:  07/05/2017    Attending Physician:  Dr. Glean Hess           Service: Cardiology-1st Round/CCU- 2634    Physician Summary completed by: Alinda Dooms, MD    Reason for hospitalization: chest pain    Significant PMH:   Past Medical History:   Diagnosis Date   ??? Chest pain    ??? Essential hypertension 09/07/2010    January, 2012 - lisinopril started    ??? 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  No known etiology at that time.   ??? HLD (hyperlipidemia) 09/09/2010   ??? HTN (hypertension)    ??? Hypertension 09/07/2010         Allergies: Shellfish containing products; Lisinopril; Other [unclassified drug]; and Shrimp    Admission Physical Exam notable for:   General:  Alert, cooperative, no distress  Head:  Normocephalic, without obvious abnormality, atraumatic  Eyes:  Conjunctivae/corneas clear.  PERRL, EOMs intact.    Throat:  Lips, mucosa and tongue normal.  Teeth and gums normal  Neck:  Supple, symmetrical, trachea midline, no JVD  Lungs:  Clear to auscultation bilaterally  Chest wall:  No tenderness or deformity.  Heart: Irregularly irregular rhythm, S1, S2 normal, no murmur  Abdomen:  Soft, non-tender.  Bowel sounds normal.   Extremities:  Extremities normal, atraumatic, no cyanosis or edema  Peripheral pulses:   2+ and symmetric, all extremities  Skin:   Skin color, texture, turgor normal.  No rashes or lesions  Neurologic:  CNII - XII intact.  Normal strength, sensation and reflexes throughout.      Admission Lab/Radiology studies notable for:   CBC AND DIFF   ??? Collection Time: 06/30/17  4:15 PM   Result Value Ref Range   ??? White Blood Cells 6.4 4.5 - 11.0 K/UL   ??? RBC 4.22 4.0 - 5.0 M/UL   ??? Hemoglobin 13.6 12.0 - 15.0 GM/DL ??? Hematocrit 41.1 36 - 45 %   ??? MCV 97.3 80 - 100 FL   ??? MCH 32.3 26 - 34 PG   ??? MCHC 33.2 32.0 - 36.0 G/DL   ??? RDW 13.8 11 - 15 %   ??? Platelet Count 252 150 - 400 K/UL   ??? MPV 7.8 7 - 11 FL   ??? Neutrophils 72 41 - 77 %   ??? Lymphocytes 14 (L) 24 - 44 %   ??? Monocytes 11 4 - 12 %   ??? Eosinophils 3 0 - 5 %   ??? Basophils 0 0 - 2 %   ??? Absolute Neutrophil Count 4.50 1.8 - 7.0 K/UL   ??? Absolute Lymph Count 0.90 (L) 1.0 - 4.8 K/UL   ??? Absolute Monocyte Count 0.70 0 - 0.80 K/UL   ??? Absolute Eosinophil Count 0.20 0 - 0.45 K/UL   ??? Absolute Basophil Count 0.00 0 - 0.20 K/UL   COMPREHENSIVE METABOLIC PANEL   ??? Collection Time:  06/30/17  4:15 PM   Result Value Ref Range   ??? Sodium 136 (L) 137 - 147 MMOL/L   ??? Potassium 3.7 3.5 - 5.1 MMOL/L   ??? Chloride 103 98 - 110 MMOL/L   ??? Glucose 98 70 - 100 MG/DL   ??? Blood Urea Nitrogen 13 7 - 25 MG/DL   ??? Creatinine 0.70 0.4 - 1.00 MG/DL   ??? Calcium 10.0 8.5 - 10.6 MG/DL   ??? Total Protein 7.2 6.0 - 8.0 G/DL   ??? Total Bilirubin 0.7 0.3 - 1.2 MG/DL   ??? Albumin 4.2 3.5 - 5.0 G/DL   ??? Alk Phosphatase 81 25 - 110 U/L   ??? AST (SGOT) 15 7 - 40 U/L   ??? CO2 27 21 - 30 MMOL/L   ??? ALT (SGPT) 9 7 - 56 U/L   ??? Anion Gap 6 3 - 12   ??? eGFR Non African American >60 >60 mL/min   ??? eGFR African American >60 >60 mL/min   MAGNESIUM   ??? Collection Time: 06/30/17  4:15 PM   Result Value Ref Range   ??? Magnesium 2.0 1.6 - 2.6 mg/dL   LIPID PROFILE   ??? Collection Time: 06/30/17  4:15 PM   Result Value Ref Range   ??? Cholesterol 206 (H) <200 MG/DL   ??? Triglycerides 89 <150 MG/DL   ??? HDL 69 >40 MG/DL   ??? LDL 131 (H) <100 MG/DL   ??? VLDL 18 MG/DL   ??? Non HDL Cholesterol 137 MG/DL   BNP (B-TYPE NATRIURETIC PEPTI)   ??? Collection Time: 06/30/17  4:15 PM   Result Value Ref Range   ??? B Type Natriuretic Peptide 373.0 (H) 0 - 100 PG/ML   TSH WITH FREE T4 REFLEX   ??? Collection Time: 06/30/17  4:15 PM   Result Value Ref Range   ??? TSH 2.240 0.35 - 5.00 MCU/ML   TROPONIN-I   ??? Collection Time: 06/30/17  4:15 PM Result Value Ref Range   ??? Troponin-I <0.01 0.0 - 0.05 NG/ML   ???  Glucose: 98 (06/30/17 1615)      Brief Hospital Course:    Karen Horton is a 77yo female with afib, history of a stroke, hypertension, hyperlipidemia, who presented with chest pain. She was started on heparin drop and as needed nitro tabs. Echo demonstrating mild LVH, mild dilated left atrium, afib, otherwise normal. Eliquis was held and she received a heart cath 12/31 demonstrating no significant coronary artery dissease. Continued management of permanent afib (has had for about 2+ years), with rate control on diltiazem orally.  She will follow up outpatient for further discussion of afib given she was having the new onset of palpitations, thought they resolved prior to discharge. Started on protonix daily as she had GERD symptoms, too.    Condition at Discharge: Stable    Discharge Diagnoses:       Hospital Problems        Active Problems    Essential hypertension    HLD (hyperlipidemia)    History of right MCA stroke    Permanent atrial fibrillation (HCC)    Chest pain    Nonsustained ventricular tachycardia [facility])          Surgical Procedures: heart catheterization 12/31    Significant Diagnostic Studies and Procedures: noted in brief hospital course    Consults:  None    Patient Disposition: Home       Patient instructions/medications:      Procedure  Specific Activity    *May return to work/school in 2 days.  *May shower after discharge.  *NO lifting, pushing or pulling more than 5 pounds for one week to affected hand. You may use your wrist splint for a reminder!  *NO strenuous activity for 1 week.  *NO driving for 2 days.  *NO baths or swimming for 1 week.  *NO sexual activity for 1 week.     Report These Signs and Symptoms    Please contact your doctor if you have any of the following symptoms: Chest pain, shortness of breath, lightheadedness, dizziness, near fainting, palpitations, abd pain, back pain, or bleeding. Questions About Your Stay    For questions or concerns regarding your hospital stay:    - DURING BUSINESS HOURS (8:00 AM - 4:30 PM):    Call [phone] and asked to be transferred to your discharge attending physician.    - AFTER BUSINESS HOURS (4:30 PM - 8:00 AM, on weekends, or holidays):  Call [phone] and ask the operator to page the on-call doctor for the discharge attending physician.     Discharging attending physician: Gloris Ham [[id number]]      Cardiac Diet    Limiting unhealthy fats and cholesterol is the most important step you can take in reducing your risk for cardiovascular disease.  Unhealthy fats include saturated and trans fats.  Monitor your sodium and cholesterol intake.  Restrict your sodium to 2g (grams) or 2000mg  (milligrams) daily, and your cholesterol to 200mg  daily.    If you have questions regarding your diet at home, you may contact a dietitian at [phone].        Current Discharge Medication List       START taking these medications    Details   pantoprazole DR (PROTONIX) 20 mg tablet Take one tablet by mouth daily.  Qty: 20 tablet, Refills: 2    PRESCRIPTION TYPE:  Normal          CONTINUE these medications which have been CHANGED or REFILLED    Details   simvastatin (ZOCOR) 10 mg tablet Take one tablet by mouth at bedtime daily.  Qty: 90 tablet, Refills: 3    PRESCRIPTION TYPE:  Normal          CONTINUE these medications which have NOT CHANGED    Details   acetaminophen (TYLENOL) 500 mg tablet Take 500 mg by mouth every 6 hours as needed for Pain. Max of 4,000 mg of acetaminophen in 24 hours.    PRESCRIPTION TYPE:  Historical Med      apixaban (ELIQUIS) 5 mg tab tablet Take 1 Tab by mouth twice daily.  Qty: 180 Tab, Refills: 4    PRESCRIPTION TYPE:  Normal      cetirizine (ZYRTEC) 10 mg tablet Take 10 mg by mouth every morning.    PRESCRIPTION TYPE:  Historical Med      cyanocobalamin (VITAMIN B-12, RUBRAMIN) 1,000 mcg/mL injection Inject 1,000 mcg to area(s) as directed every 30 days.    PRESCRIPTION TYPE:  Historical Med      diclofenac sodium (PENNSAID TP) Apply  topically to affected area.    PRESCRIPTION TYPE:  Historical Med      diltiazem CD (CARDIZEM CD) 120 mg capsule Take 120 mg by mouth daily.    PRESCRIPTION TYPE:  Historical Med      FOLIC ACID/MULTIVIT-MIN/LUTEIN (CENTRUM SILVER PO) Take 1 tablet by mouth daily.    PRESCRIPTION TYPE:  Historical Med  Scheduled appointments:    Aug 02, 2017  9:30 AM CST  Hospital Follow Up with Vanice Sarah, MD  Cardiovascular Medicine at [facility] [place]) [address]  [address]  [place] [id number]  [phone]          Pending items needing follow up: management of afib    Signed:  Alinda Dooms, MD  07/06/2017      cc:  Primary Care Physician:  Gwenette Greet   Verified  Referring physicians:  Rennis Golden, DO   Additional provider(s):

## 2017-08-02 ENCOUNTER — Encounter: Admit: 2017-08-02 | Discharge: 2017-08-02 | Payer: MEDICARE

## 2017-08-02 ENCOUNTER — Ambulatory Visit: Admit: 2017-08-02 | Discharge: 2017-08-03 | Payer: MEDICARE

## 2017-08-02 DIAGNOSIS — I10 Essential (primary) hypertension: Secondary | ICD-10-CM

## 2017-08-02 DIAGNOSIS — I4821 Permanent atrial fibrillation: ICD-10-CM

## 2017-08-02 DIAGNOSIS — E785 Hyperlipidemia, unspecified: ICD-10-CM

## 2017-08-02 DIAGNOSIS — R072 Precordial pain: ICD-10-CM

## 2017-08-02 DIAGNOSIS — Z8673 Personal history of transient ischemic attack (TIA), and cerebral infarction without residual deficits: ICD-10-CM

## 2017-08-02 DIAGNOSIS — E782 Mixed hyperlipidemia: ICD-10-CM

## 2017-08-02 DIAGNOSIS — K210 Gastro-esophageal reflux disease with esophagitis: Principal | ICD-10-CM

## 2017-08-02 LAB — LIPID PROFILE
Lab: 152
Lab: 20
Lab: 3 10*3/uL (ref 1.8–7.0)
Lab: 50
Lab: 99

## 2017-08-04 ENCOUNTER — Encounter: Admit: 2017-08-04 | Discharge: 2017-08-04 | Payer: MEDICARE

## 2017-08-17 ENCOUNTER — Encounter: Admit: 2017-08-17 | Discharge: 2017-08-17 | Payer: MEDICARE

## 2017-08-17 DIAGNOSIS — E782 Mixed hyperlipidemia: Principal | ICD-10-CM

## 2017-08-18 ENCOUNTER — Encounter: Admit: 2017-08-18 | Discharge: 2017-08-18 | Payer: MEDICARE

## 2017-08-22 IMAGING — CR LOW_EXM
3 series · 3 of 3 positions shown · non-contrast
Comparison: none

[knee ap]
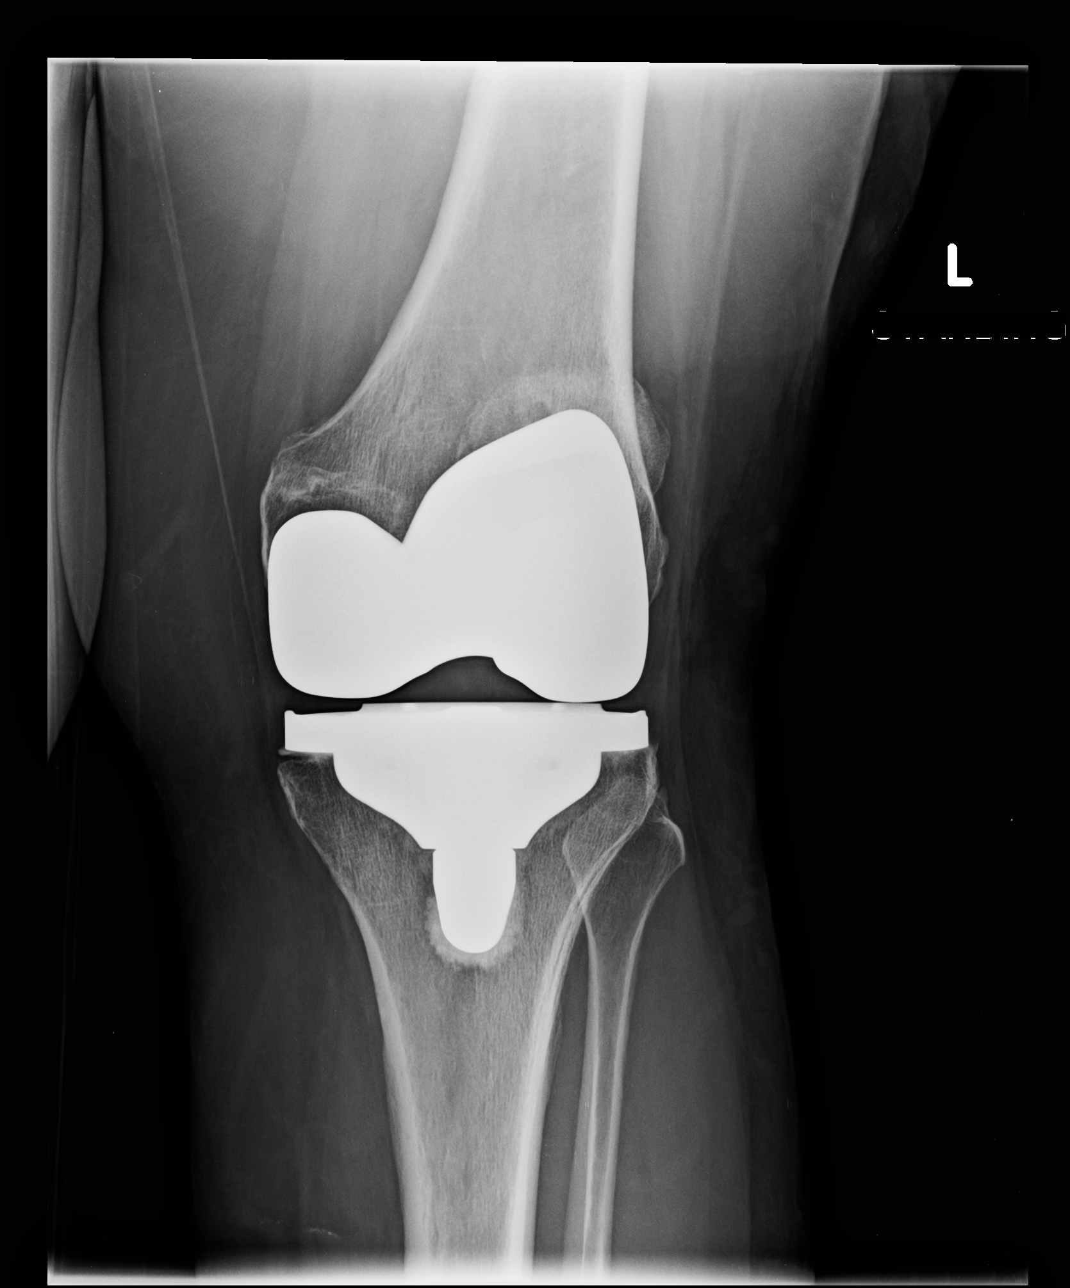

[knee sunrise]
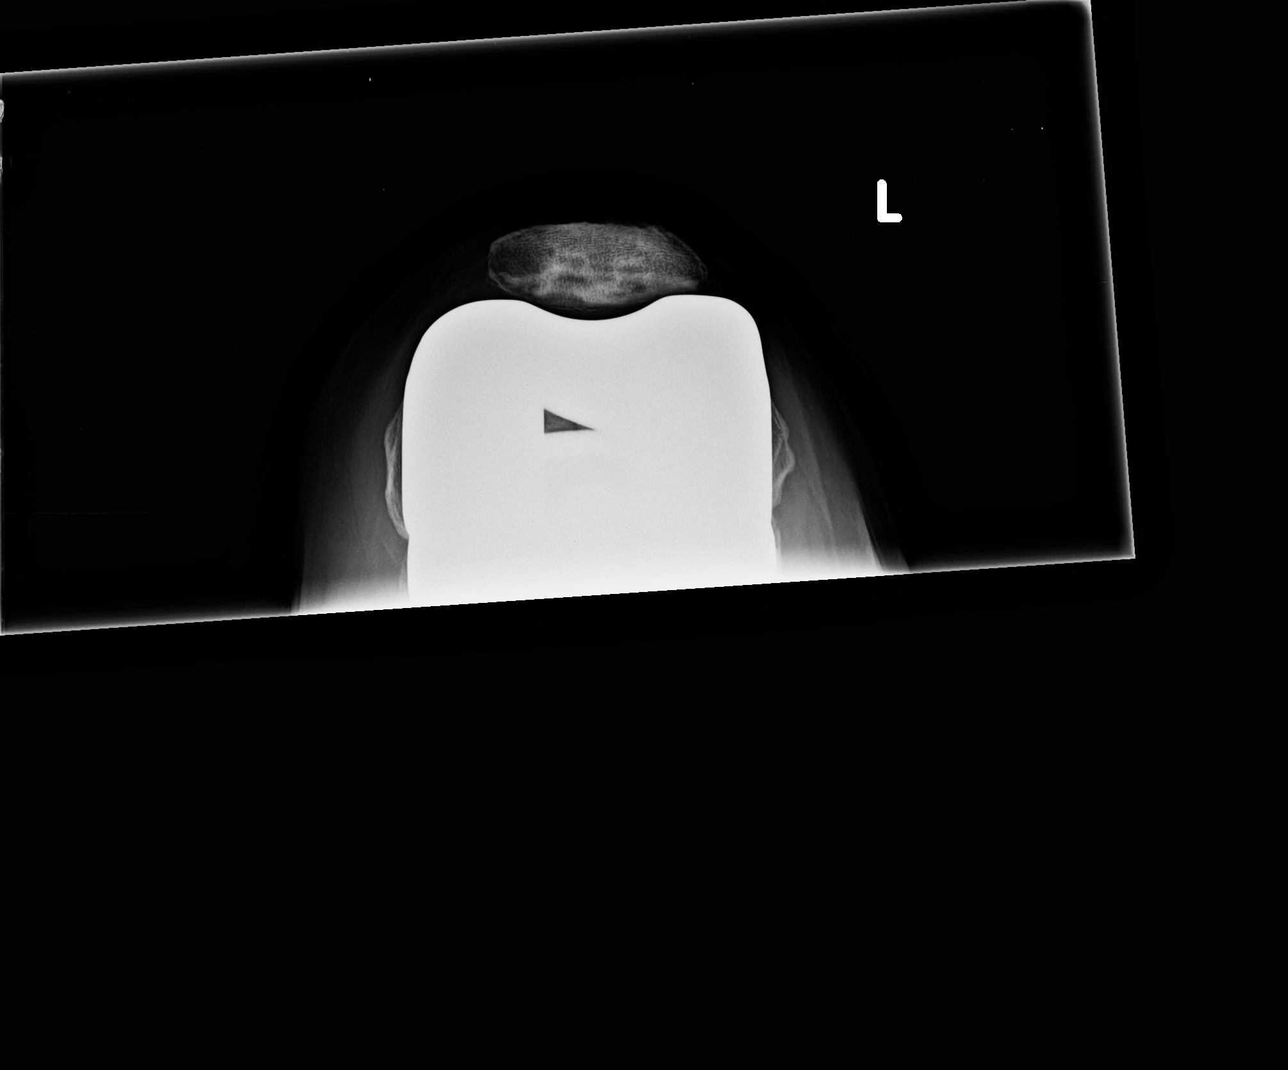

[knee lat]
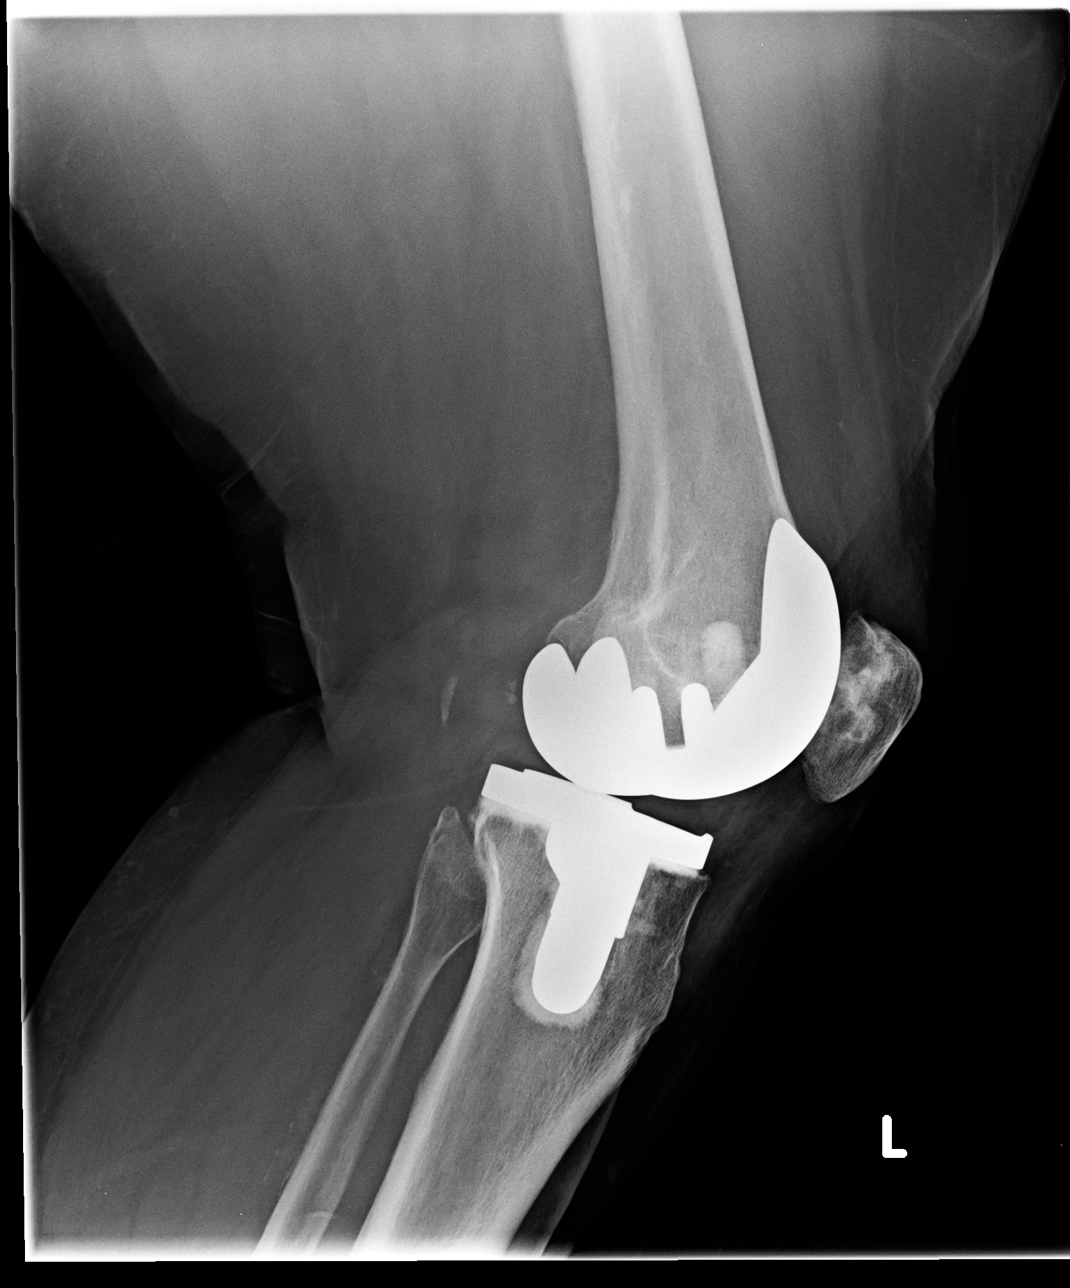

[3 of 3 positions shown; findings below may reference images not displayed]

DIAGNOSTIC STUDIES

EXAM

Left knee radiograph

INDICATION

left knee pain
PT. C/O KNEE PAIN. HX: TKA X3 YEARS AGO.

TECHNIQUE

AP, sunrise, and lateral views of the left knee

COMPARISONS

None

FINDINGS

There is a left total knee prosthesis. There is 4 millimeters of sclerosis along the medial aspect
of the femoral condyle component seen on AP view. No definite other periprosthetic lucency. No
periprosthetic fracture. Probable small joint effusion.

IMPRESSION

Left total knee prosthesis. There is a 4 millimeter rim of lucency around the medial portion of
the femoral condyle component that could reflect loosening. Recommend correlation with prior
imaging if available.

## 2017-10-06 ENCOUNTER — Encounter: Admit: 2017-10-06 | Discharge: 2017-10-06 | Payer: MEDICARE

## 2017-10-06 DIAGNOSIS — E785 Hyperlipidemia, unspecified: ICD-10-CM

## 2017-10-06 DIAGNOSIS — Z8673 Personal history of transient ischemic attack (TIA), and cerebral infarction without residual deficits: ICD-10-CM

## 2017-10-06 DIAGNOSIS — I10 Essential (primary) hypertension: Secondary | ICD-10-CM

## 2017-10-14 ENCOUNTER — Encounter: Admit: 2017-10-14 | Discharge: 2017-10-14 | Payer: MEDICARE

## 2017-10-17 ENCOUNTER — Encounter: Admit: 2017-10-17 | Discharge: 2017-10-17 | Payer: MEDICARE

## 2018-05-29 IMAGING — CR CHEST
2 series · 2 of 2 positions shown · non-contrast
Comparison: none

[chest port x-wise]
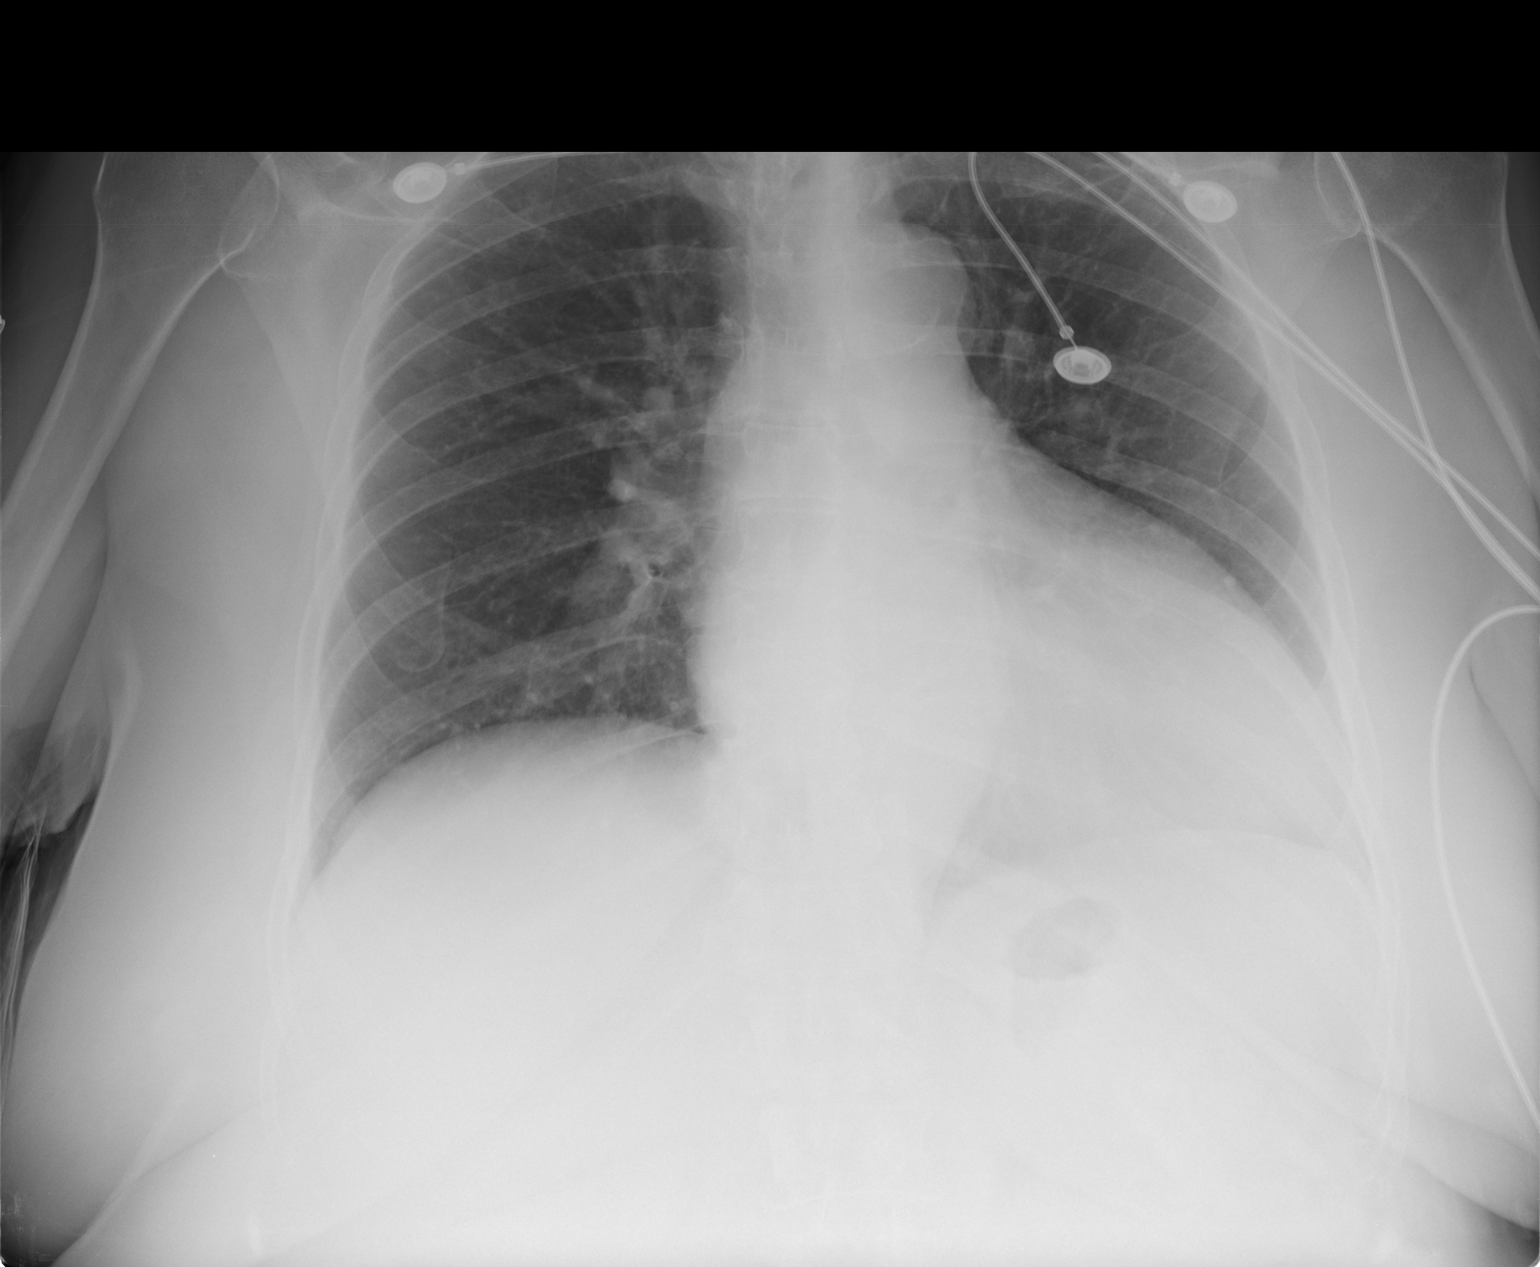

[chest lat]
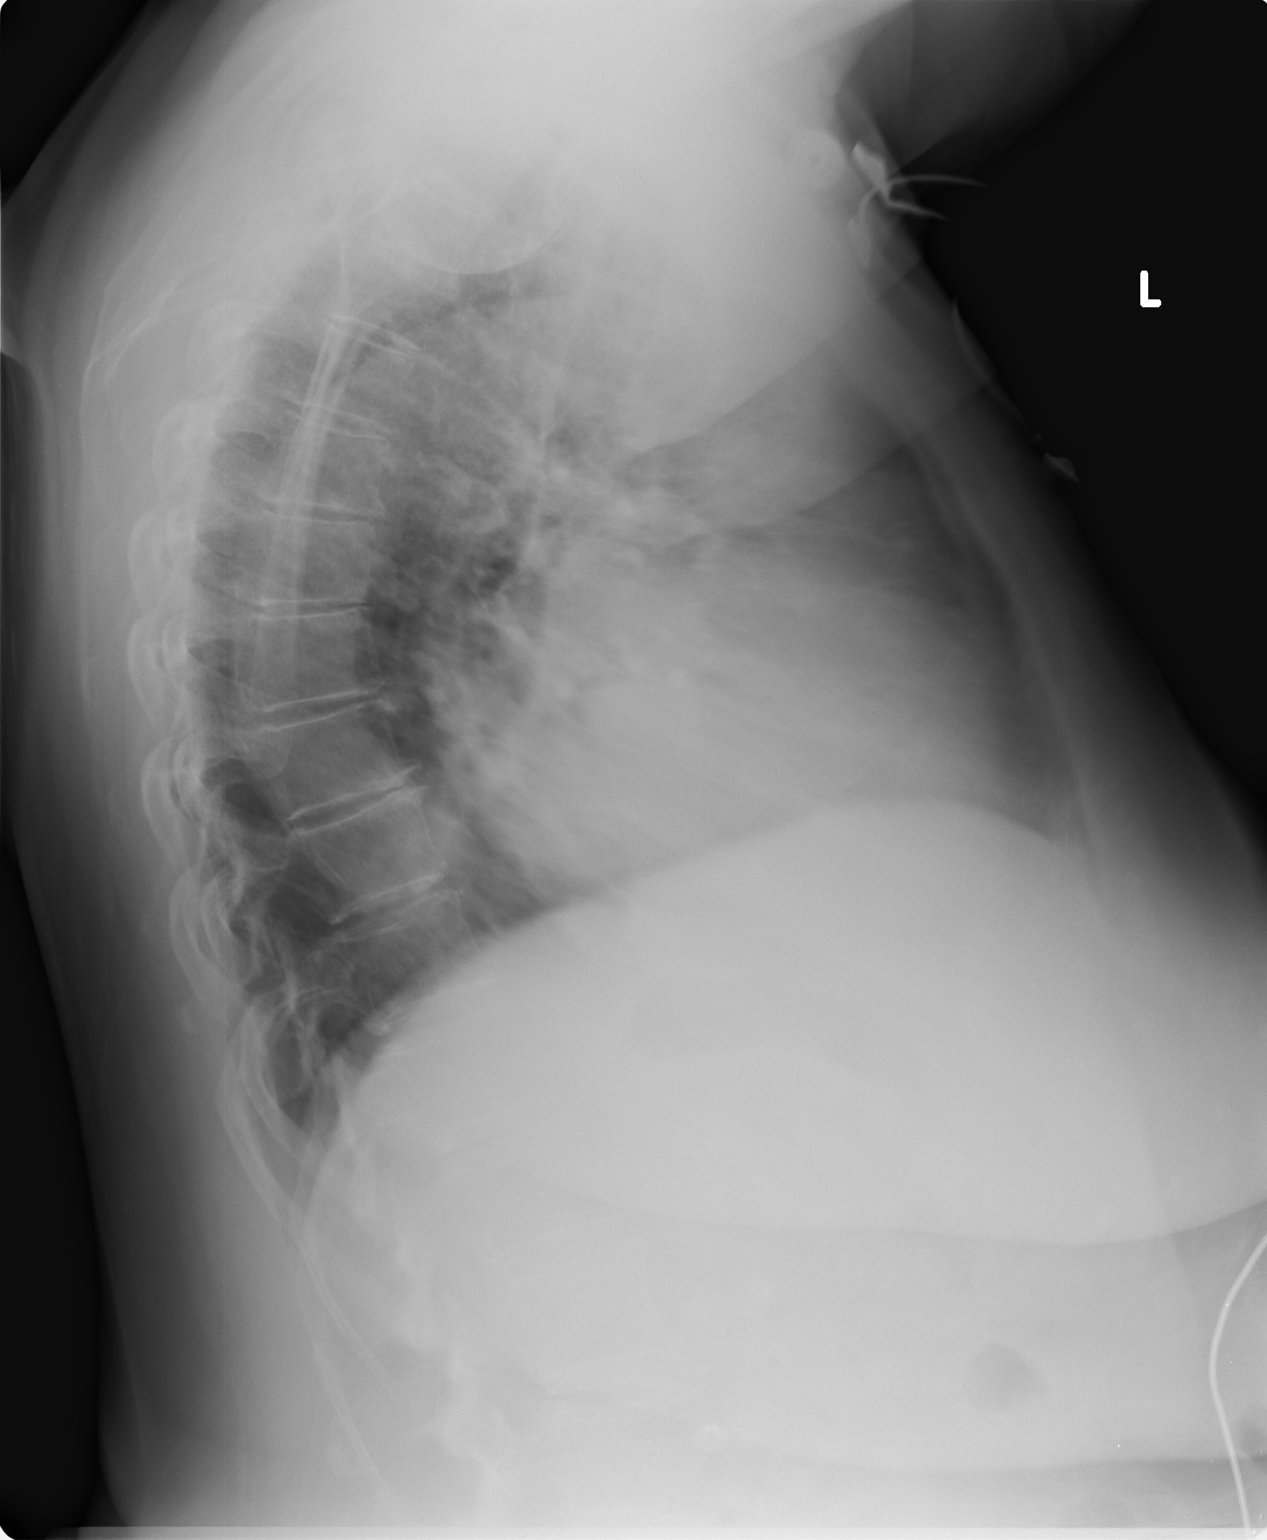

[2 of 2 positions shown; findings below may reference images not displayed]

DIAGNOSTIC STUDIES

EXAM
RADIOLOGICAL EXAMINATION, CHEST; 2 VIEWS FRONTAL AND LATERAL CPT 69434

INDICATION
Chest pain

TECHNIQUE
2 views of the chest were acquired.

COMPARISONS
Chest radiograph June 30, 2017.

FINDINGS
The cardiac silhouette unchanged and mildly enlarged. The mediastinum is not widened or deviated.
The lungs are clear and the costophrenic sulci are sharp. Pulmonary vasculature is normal caliber.
No pneumothorax is identified.

IMPRESSION
Unchanged mild cardiomegaly without congestive heart failure or dense consolidating pneumonia.

Tech Notes:

pt. c/o central chest pain/epigastric pain.

## 2018-05-29 IMAGING — US ABDLM
1 series · 14 of 25 positions shown · non-contrast
Comparison: none

[Series 1: us abdomen limited · 14 of 65 slices shown]
[im 1/65]
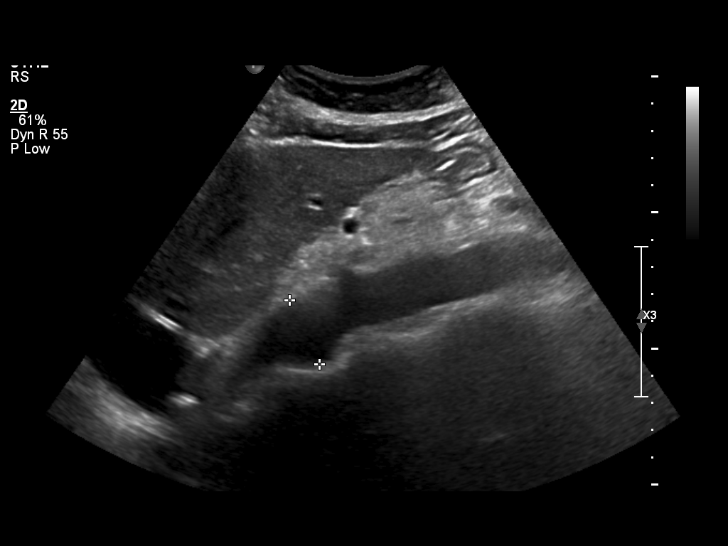
[im 6/65]
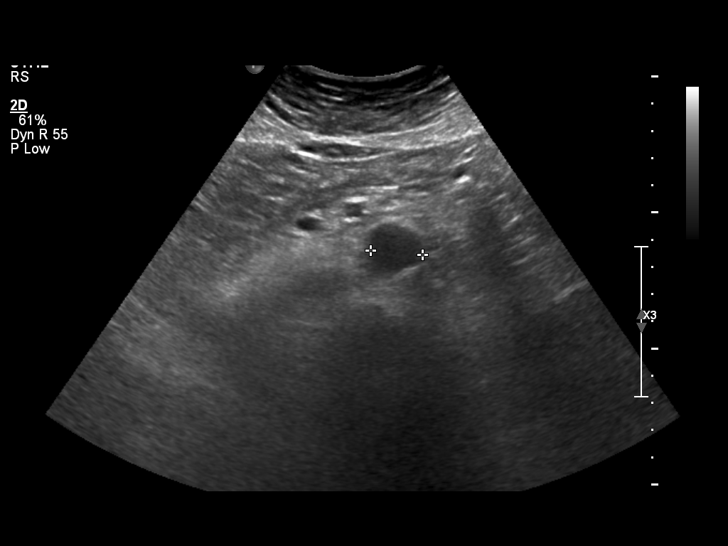
[im 11/65]
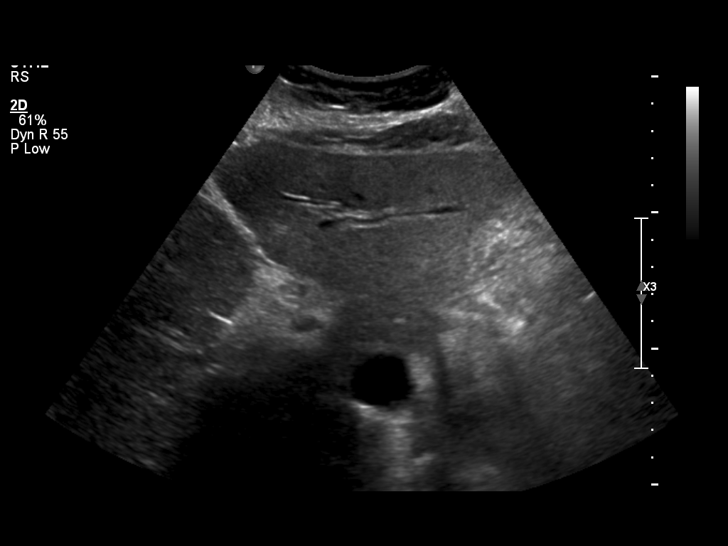
[im 17/65]
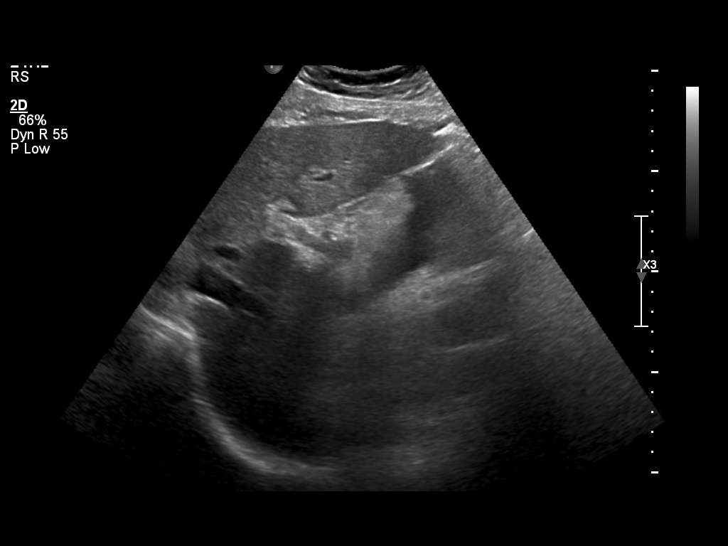
[im 22/65]
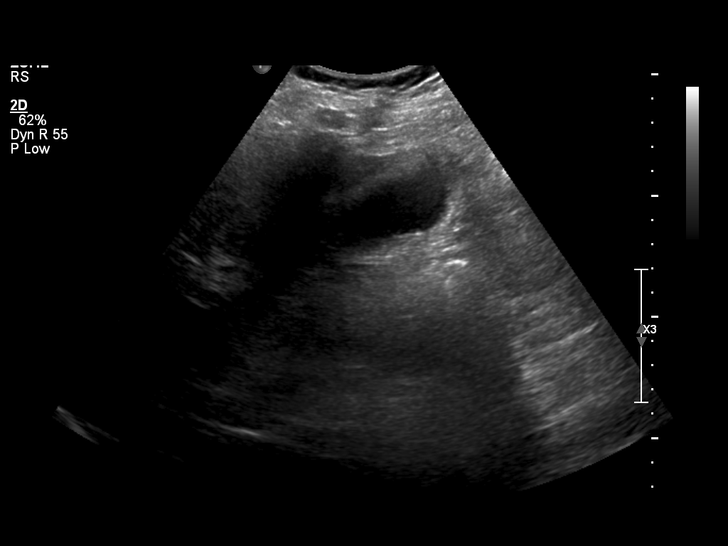
[im 25/65]
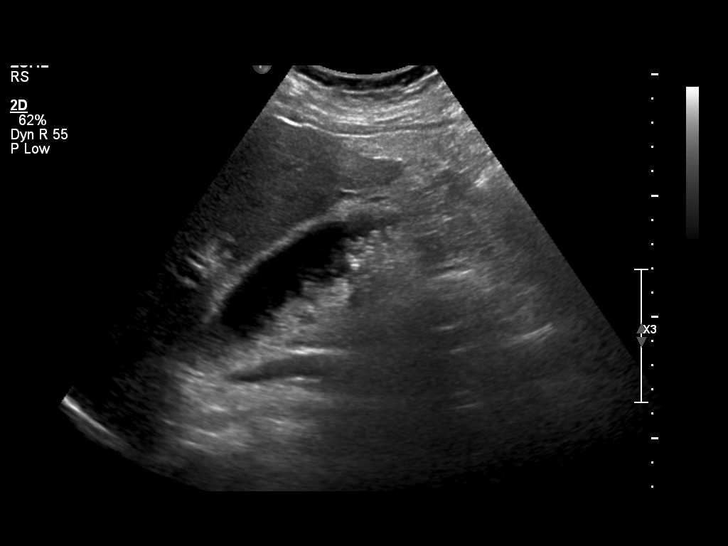
[im 30/65]
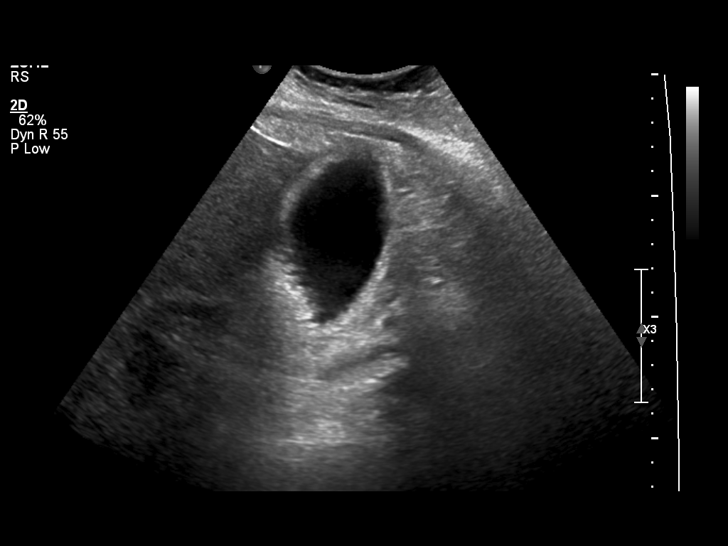
[im 35/65]
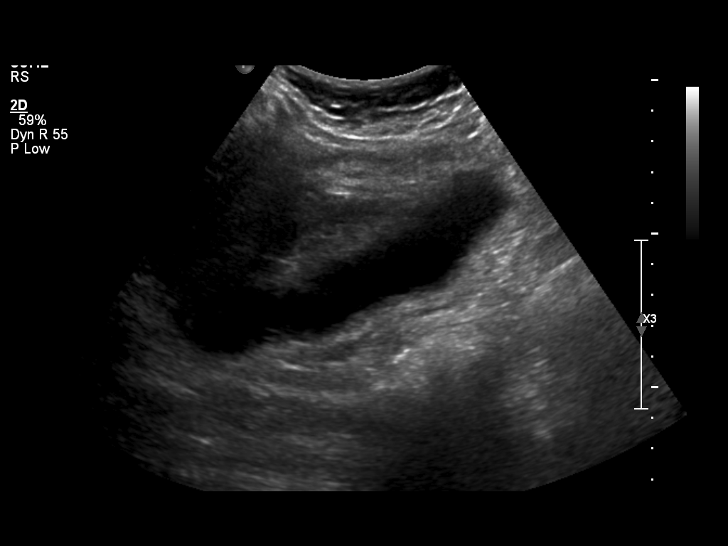
[im 41/65]
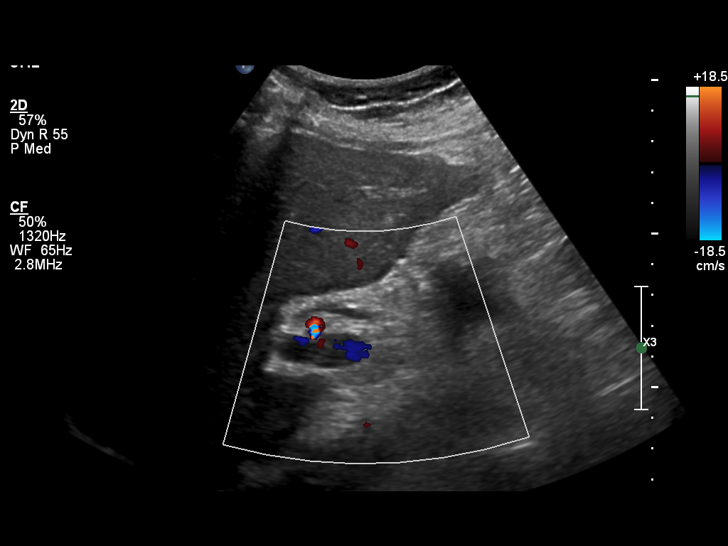
[im 43/65]
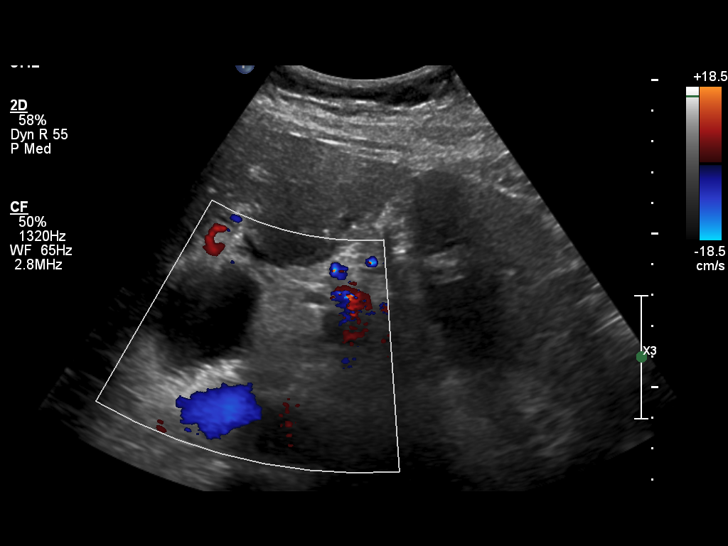
[im 49/65]
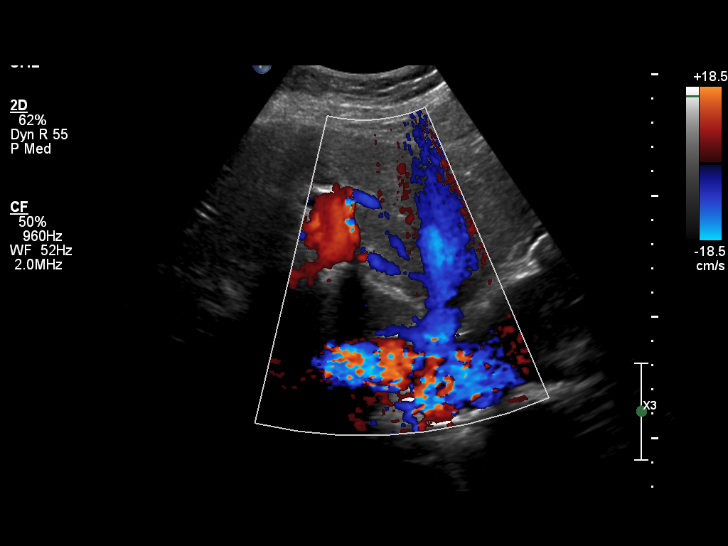
[im 54/65]
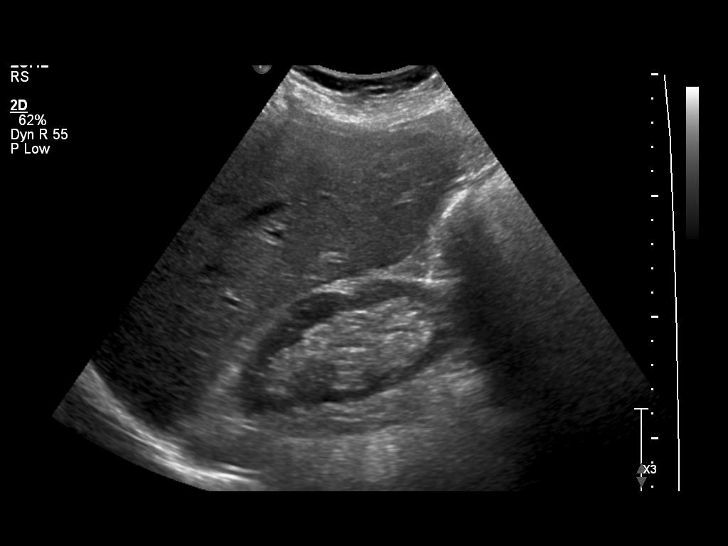
[im 59/65]
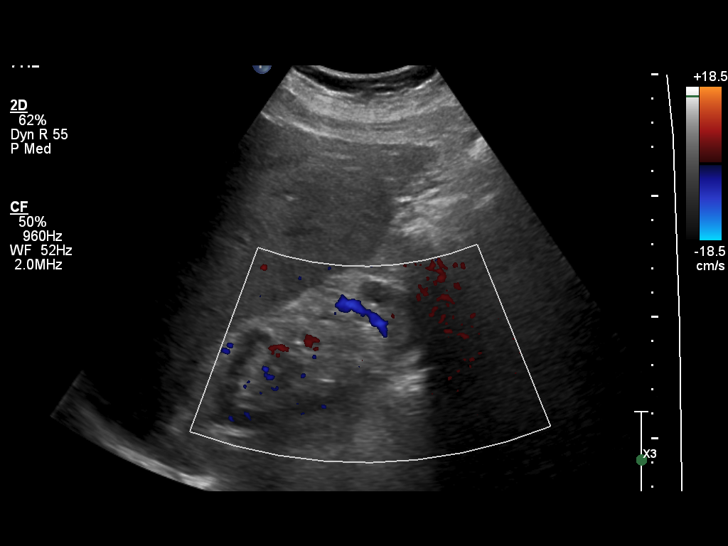
[im 65/65]
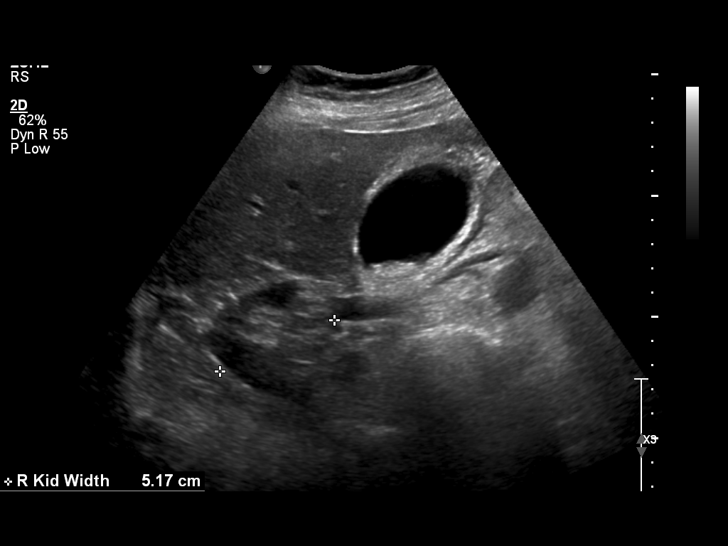

[14 of 25 positions shown; findings below may reference images not displayed]

DIAGNOSTIC STUDIES

EXAM

ULTRASOUND, ABDOMINAL, REAL TIME WITH IMAGE DOCUMENTATION, COMPLETE; CPT 40444

INDICATION

Right upper quadrant pain.

COMPARISONS

No priors available for comparison.

FINDINGS

The Liver is normal in size and echotexture. There is no evidence of intra or extrahepatic biliary
ductal dilatation.

There are multiple gallstones with gallbladder wall thickening, 4.1 millimeters, and a sonographic
Murphy sign.

Limited evaluation of the pancreas secondary to overlying bowel gas.

No evidence of right sided hydronephrosis or calculi.

The Aorta is normal in caliber and contour. There is no evidence of aneurismal dilatation.

The Inferior Vena Cava is patent.

IMPRESSION

1. Acute cholecystitis.

Follow up with a surgical consultation is needed.

Tech Notes:

jl

## 2018-05-30 LAB — COMPREHENSIVE METABOLIC PANEL
Lab: 10 — ABNORMAL HIGH (ref 27.0–31.0)
Lab: 106
Lab: 138 — ABNORMAL LOW (ref 4.20–5.40)
Lab: 23 — ABNORMAL HIGH (ref 80.0–99.0)
Lab: 4.2

## 2018-05-30 LAB — CBC: Lab: 3.9 — ABNORMAL LOW (ref 4.8–10.8)

## 2018-07-04 ENCOUNTER — Encounter: Admit: 2018-07-04 | Discharge: 2018-07-04 | Payer: MEDICARE

## 2018-07-11 ENCOUNTER — Ambulatory Visit: Admit: 2018-07-11 | Discharge: 2018-07-11 | Payer: MEDICARE

## 2018-07-11 ENCOUNTER — Encounter: Admit: 2018-07-11 | Discharge: 2018-07-11 | Payer: MEDICARE

## 2018-07-11 DIAGNOSIS — E785 Hyperlipidemia, unspecified: Secondary | ICD-10-CM

## 2018-07-11 DIAGNOSIS — I10 Essential (primary) hypertension: Secondary | ICD-10-CM

## 2018-07-11 DIAGNOSIS — E782 Mixed hyperlipidemia: Secondary | ICD-10-CM

## 2018-07-11 DIAGNOSIS — I4821 Permanent atrial fibrillation: Secondary | ICD-10-CM

## 2018-07-11 DIAGNOSIS — Z8673 Personal history of transient ischemic attack (TIA), and cerebral infarction without residual deficits: Secondary | ICD-10-CM

## 2019-03-31 ENCOUNTER — Encounter: Admit: 2019-03-31 | Discharge: 2019-03-31 | Payer: MEDICARE

## 2019-04-13 ENCOUNTER — Encounter: Admit: 2019-04-13 | Discharge: 2019-04-13 | Payer: MEDICARE

## 2019-04-13 NOTE — Telephone Encounter
Received fax from Dr. Sherald Hess office stating that patient was seen in their clinic on 10/8 with complaints of increasing palpitations.  Her BP was also elevated in office at 188/76.  EKGs from Dr. Sherald Hess office attached for review.    Called pt to discuss.  She states for the past few months, she has been noticing more palpitations.  She states that they are there at rest and with activity, but she notices them the most when she is lying down at night.  She states that she is feeling well today, but was noticing some lightheadedness on Wednesday.  She denies any chest pain or nausea, no syncopal episodes.  She does state that she occasionally fells short of breath, and she does notice some mild swelling in her ankles in the evening.  Confirmed that she is taking Diltiazem and Apixiban as prescribed.    Scheduled pt for follow up at first available appointment on 10/20 with Dr. Jacklyn Shell.  Pt is going to continue to monitor her blood pressure and heart rate at home.  She will seek emergency care if her symptoms worsen or change.      Will route to Dr. Ricard Dillon for any additional recommendations for patient.

## 2019-04-24 ENCOUNTER — Encounter: Admit: 2019-04-24 | Discharge: 2019-04-24 | Payer: MEDICARE

## 2019-04-24 DIAGNOSIS — Z8673 Personal history of transient ischemic attack (TIA), and cerebral infarction without residual deficits: Secondary | ICD-10-CM

## 2019-04-24 DIAGNOSIS — I10 Essential (primary) hypertension: Secondary | ICD-10-CM

## 2019-04-24 DIAGNOSIS — I4821 Permanent atrial fibrillation: Secondary | ICD-10-CM

## 2019-04-24 DIAGNOSIS — E785 Hyperlipidemia, unspecified: Secondary | ICD-10-CM

## 2019-04-24 DIAGNOSIS — E782 Mixed hyperlipidemia: Secondary | ICD-10-CM

## 2019-04-24 LAB — BASIC METABOLIC PANEL
Lab: 0.8
Lab: 103
Lab: 138
Lab: 15 — ABNORMAL HIGH (ref 0–14)
Lab: 19
Lab: 24
Lab: 4
Lab: 71
Lab: 9.6
Lab: 96

## 2019-04-24 MED ORDER — DILTIAZEM HCL 180 MG PO CP24
180 mg | ORAL_CAPSULE | Freq: Every day | ORAL | 3 refills | 45.00000 days | Status: DC
Start: 2019-04-24 — End: 2019-07-24

## 2019-04-24 NOTE — Progress Notes
Date of Service: 04/24/2019    Karen Horton is a 78 y.o. female.       HPI     Karen Horton is followed for permanent atrial fibrillation.  She reports that her systolic blood pressures have been in the range of 130 to 150 mmHg recently.  Otherwise, she indicates that she has been doing well.  On Mondays Wednesdays and Fridays she does water aerobics for an hour at the [employer].  This is followed by an hour of enhanced aerobics for seniors. The patient reports no angina, congestive symptoms, palpitations, sensation of sustained forceful heart pounding, lightheadedness or syncope.  Her exercise tolerance has been stable. The patient reports no claudication, myalgias, bleeding abnormalities, neurologic motor abnormalities or difficulty with speech.        Vitals:    04/24/19 1217 04/24/19 1222   BP: (!) 152/90 (!) 144/92   BP Source: Arm, Left Upper Arm, Right Upper   Pulse: 70    Temp: 36.8 ?C (98.2 ?F)    SpO2: 97%    Weight: 88.9 kg (196 lb)    Height: 1.651 m (5' 5)    PainSc: Zero      Body mass index is 32.62 kg/m?Marland Kitchen     Past Medical History  Patient Active Problem List    Diagnosis Date Noted   ? 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    08/2017 - GI consult planned--Dr. Salvadore Farber in [place]     ? Precordial pain 08/02/2017   ? Permanent atrial fibrillation (HCC) 04/17/2014     2014 - Stroke--no known AF at that time  2015 - Presented with asymptomatic AF.  Eliquis started.  Chronic, permanent AF.  2016 - Diltiazem started for rate control     ? 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  No known etiology at that time.     ? HLD (hyperlipidemia) 09/09/2010   ? Essential hypertension 09/07/2010     January, 2012 - lisinopril started           Review of Systems   Constitution: Negative.   HENT: Negative.    Eyes: Negative.    Cardiovascular: Positive for dyspnea on exertion.   Endocrine: Negative. Hematologic/Lymphatic: Negative.    Skin: Negative.    Musculoskeletal: Positive for arthritis and joint pain.   Gastrointestinal: Negative.    Genitourinary: Negative.    Neurological: Positive for headaches.   Psychiatric/Behavioral: Negative.    Allergic/Immunologic: Negative.        Physical Exam  GENERAL: The patient is well developed, well nourished, resting comfortably and in no distress.   HEENT: No abnormalities of the visible oro-nasopharynx, conjunctiva or sclera are noted.  NECK: There is no jugular venous distension. Carotids are palpable and without bruits. There is no thyroid enlargement.  Chest: Lung fields are clear to auscultation. There are no wheezes or crackles.  CV: There is an irregular rhythm variable intensity of the first heart sound.  There are no murmurs, gallops or rubs.  ABD: The abdomen is soft and supple with normal bowel sounds. There is no hepatosplenomegaly, ascites, tenderness, masses or bruits.  Neuro: There are no focal motor defects. Ambulation is normal. Cognitive function appears normal.  Ext: There is no edema or evidence of deep vein thrombosis. Peripheral pulses are satisfactory.    SKIN: There are no rashes and no cellulitis  PSYCH:  The patient is calm, rationale and oriented.    Cardiovascular Studies  A twelve-lead ECG was obtained on 04/24/2019 reveals atrial fibrillation with a rate of 74 bpm.  Left anterior fascicular block is noted along with T wave inversion in the inferior and anterior leads which is unchanged when compared to a prior ECG obtained on 07/02/2017.  Labs from August 02, 2017 revealed total cholesterol 152, triglycerides 99, HDL 50 and LDL cholesterol 87 mg/dL.    Echo Doppler 07/01/2017:  ? Left atrium is moderately dilated  ? Right ventricular chamber size is normal  ? Left ventricular chamber size is normal; with mild left ventricular hypertrophy  ? Normal left ventricular systolic function, with an ejection fraction estimated at 60% ? Atrial fibrillation  ? Pulmonary artery pressure is estimated at 26 mmHg    Problems Addressed Today  Encounter Diagnoses   Name Primary?   ? Essential hypertension Yes     Permanent atrial fibrillation  Assessment and Plan     Alternatives for the treatment of hypertension were reviewed with the patient.  She wanted to increase her diltiazem CD from 120-180 mg daily for control of her hypertension. I have asked the patient to keep a log book of her BP readings and to report BP readings exceeding 130/80 mm Hg.  I have asked her to report her blood pressure readings in 1 month regardless. The patient was given a requisition to check her Chem 7. I have asked her to return for follow-up in 3 months to follow her progress.         Current Medications (including today's revisions)  ? acetaminophen (TYLENOL) 500 mg tablet Take 500 mg by mouth every 6 hours as needed for Pain. Max of 4,000 mg of acetaminophen in 24 hours.   ? apixaban (ELIQUIS) 5 mg tab tablet Take 1 Tab by mouth twice daily.   ? cetirizine (ZYRTEC) 10 mg tablet Take 10 mg by mouth every morning.   ? cyanocobalamin (VITAMIN B-12, RUBRAMIN) 1,000 mcg/mL injection Inject 1,000 mcg to area(s) as directed every 30 days.   ? diclofenac sodium (PENNSAID TP) Apply  topically to affected area.   ? dilTIAZem CD (CARDIZEM CD) 180 mg capsule Take one capsule by mouth daily.   ? FOLIC ACID/MULTIVIT-MIN/LUTEIN (CENTRUM SILVER PO) Take 1 tablet by mouth daily.   ? oxybutynin XL (DITROPAN XL) 5 mg tablet Take 1 tablet by mouth daily.   ? pantoprazole DR (PROTONIX) 20 mg tablet Take one tablet by mouth daily.   ? simvastatin (ZOCOR) 10 mg tablet Take one tablet by mouth at bedtime daily.

## 2019-05-23 IMAGING — MG MAMMOGRAM 3D SCREEN, BILATERAL
12 of 16 series · 12 of 16 positions shown · non-contrast
Comparison: none

[R CC (1 of 2)]
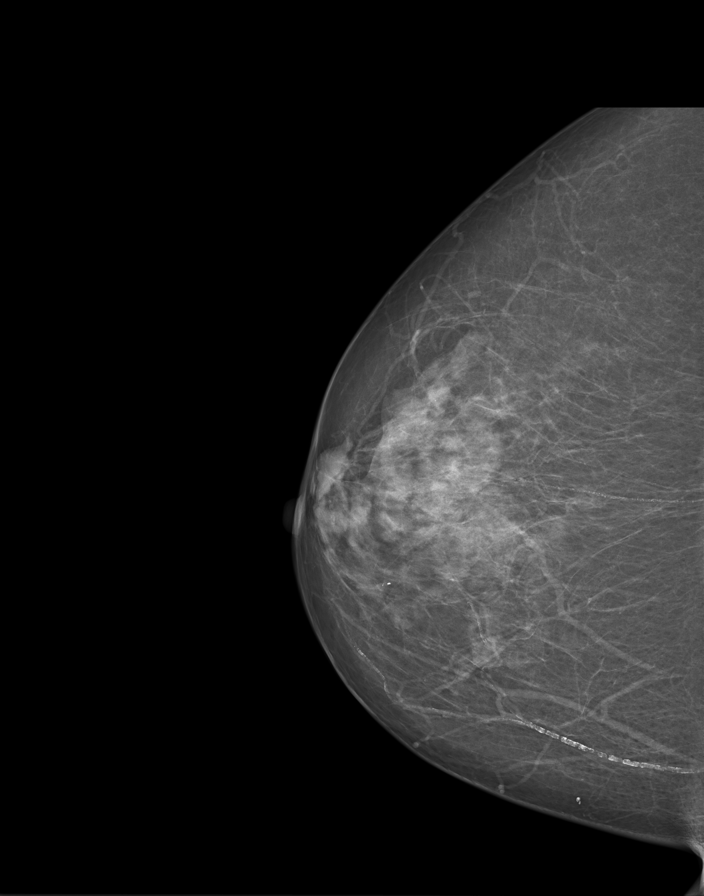

[R tomo (1 of 2)]
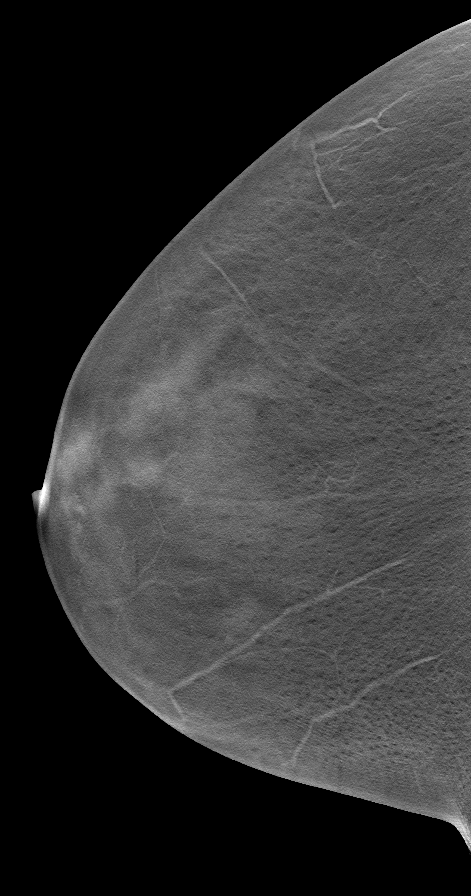

[R CC (2 of 2)]
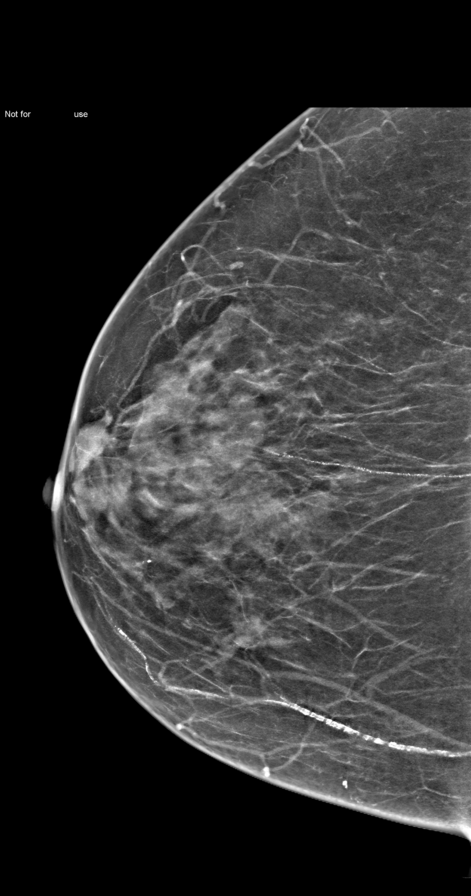

[R (1 of 2)]
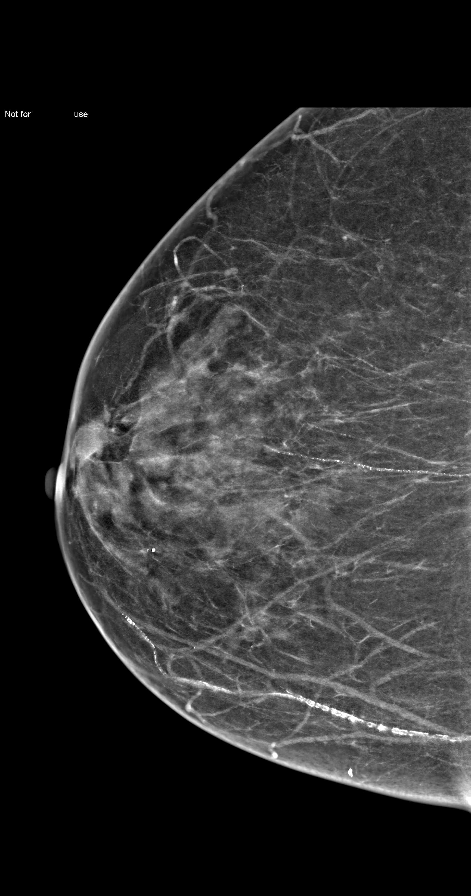

[L CC (1 of 2)]
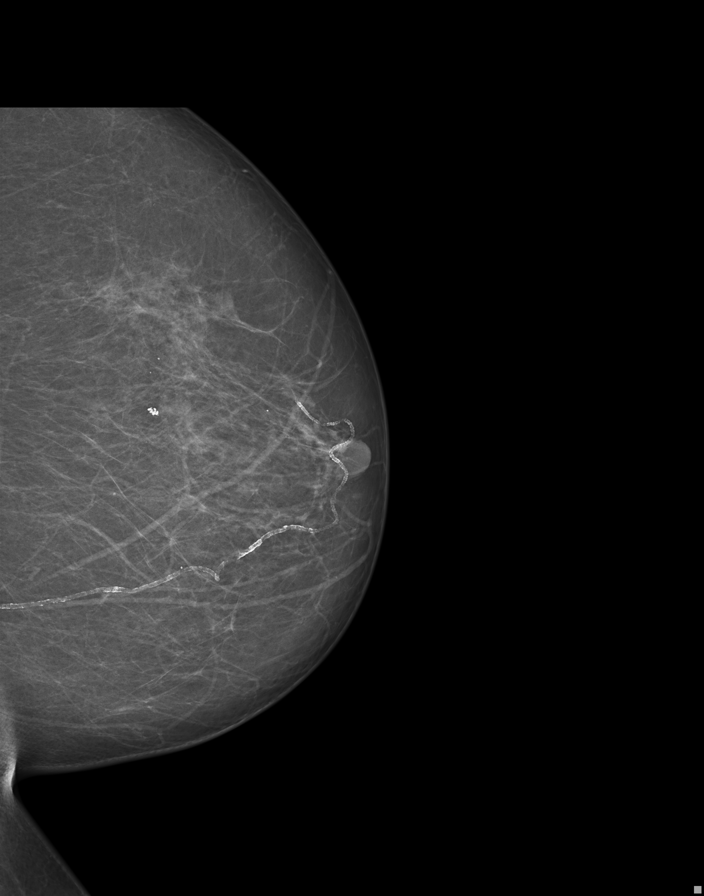

[L tomo]
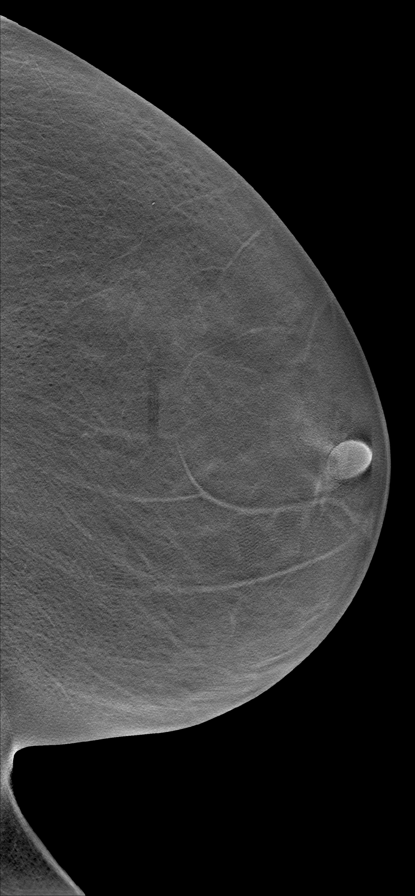

[L CC (2 of 2)]
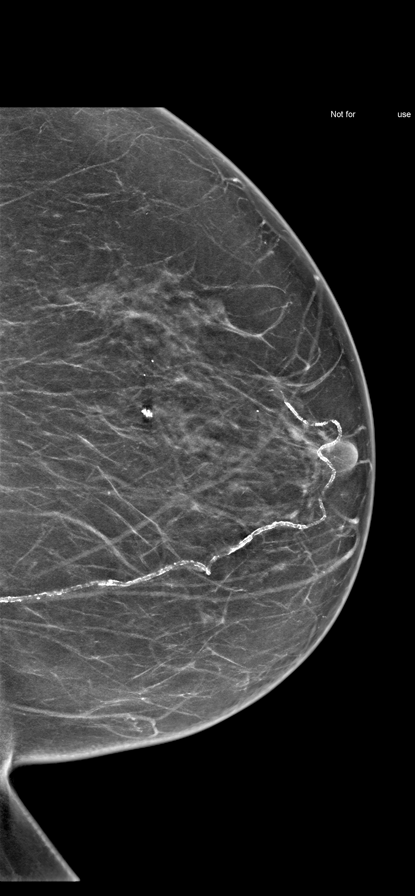

[L]
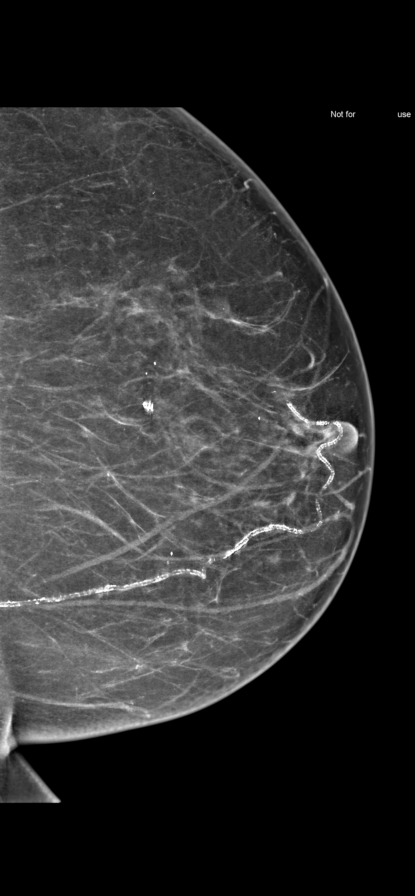

[R MLO (1 of 2)]
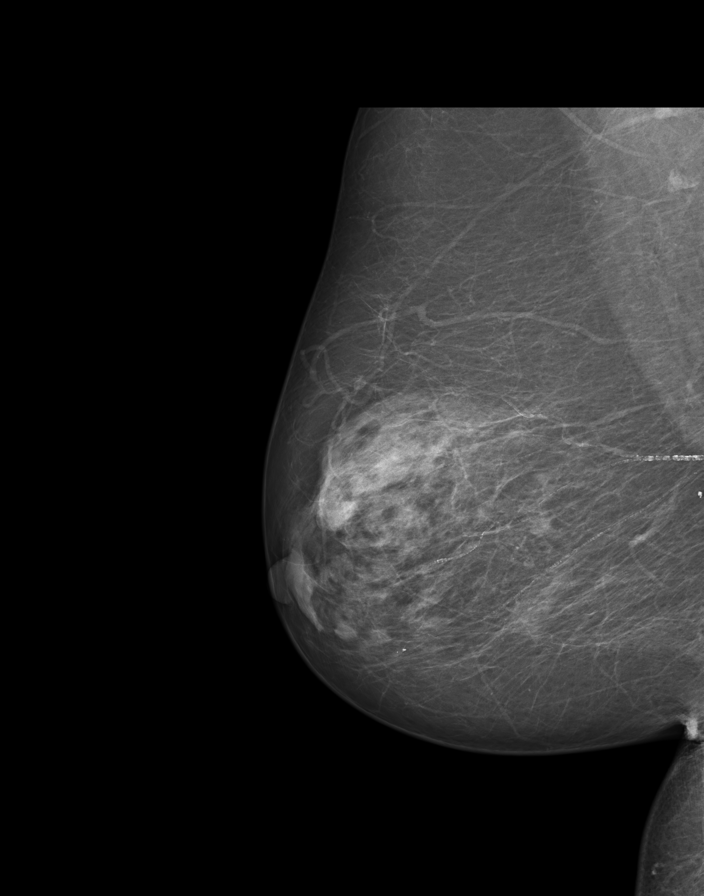

[R tomo (2 of 2)]
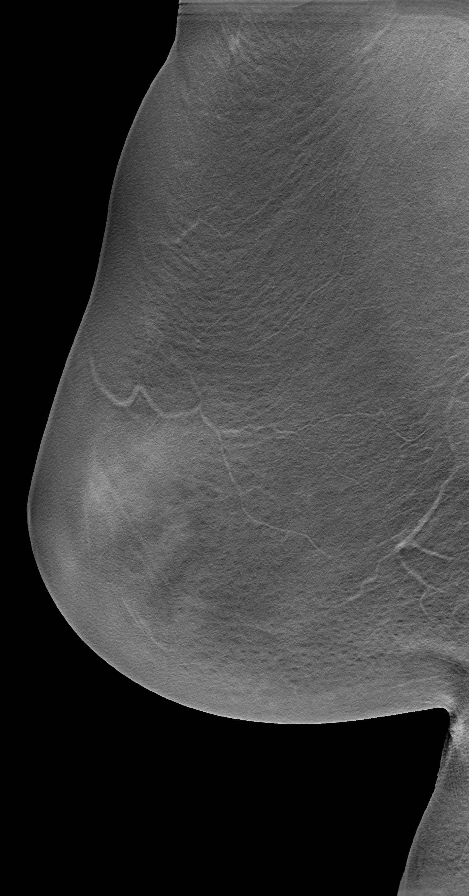

[R MLO (2 of 2)]
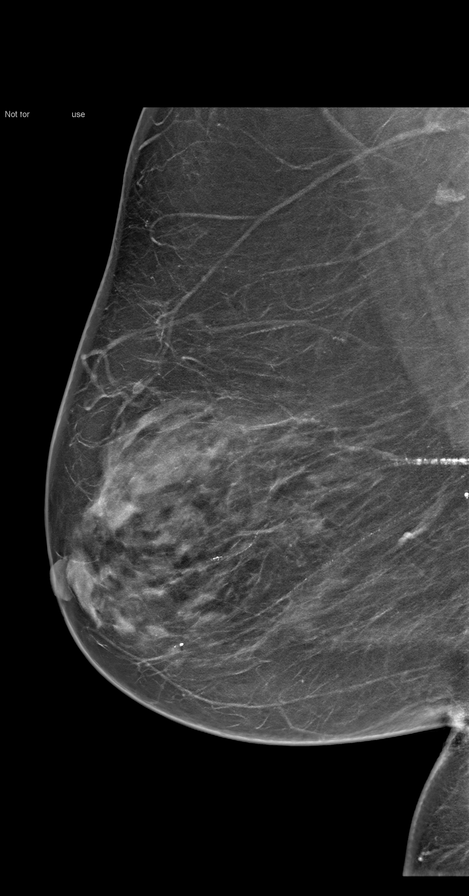

[R (2 of 2)]
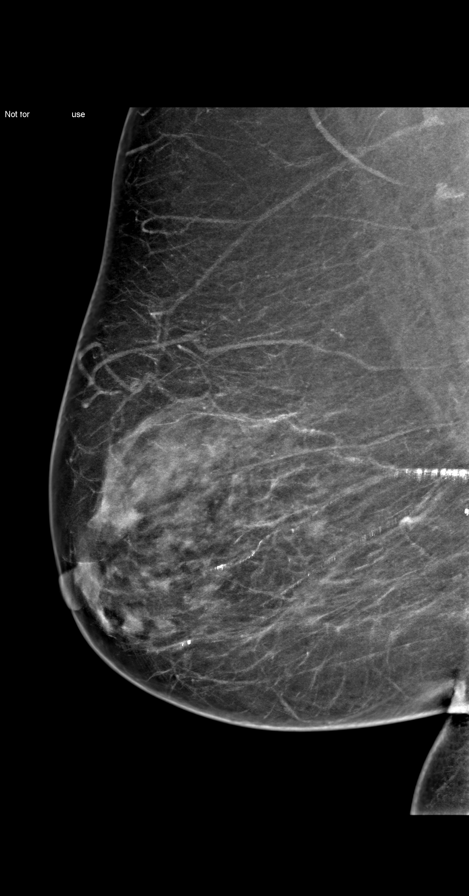

[12 of 16 positions shown; findings below may reference images not displayed]

## 2019-06-12 ENCOUNTER — Encounter: Admit: 2019-06-12 | Discharge: 2019-06-12 | Payer: MEDICARE

## 2019-06-12 NOTE — Telephone Encounter
06/12/2019 4:37 PM   Spoke with patient and gave her the recommendations per SBG and she would like to stay on 180 mg diltiazem until her appointment with him in January 2021. Patient instructed to continue monitoring her bp and call with further concerns or questions. Geralyn Corwin, RN      Jacklyn Shell Shelbie Hutching, MD  Katina Degree, RN  Caller: Unspecified (Today, 1:50 PM)  Threasa Beards: Her blood pressure readings are reasonable. She has the option of increasing her diltiazem CD from 180 to 240 mg daily. I can review with her when she returns for follow-up in clinic. Thanks. SBG

## 2019-06-12 NOTE — Telephone Encounter
Patient brought in blood pressures as requested at her last office visit after increasing Cardizem from 120mg  to 180 mg.      Will route to Dr. Jacklyn Shell for review and recommendations.

## 2019-07-13 ENCOUNTER — Encounter: Admit: 2019-07-13 | Discharge: 2019-07-13 | Payer: MEDICARE

## 2019-07-13 ENCOUNTER — Ambulatory Visit: Admit: 2019-07-13 | Discharge: 2019-07-13 | Payer: MEDICARE

## 2019-07-13 DIAGNOSIS — I4891 Unspecified atrial fibrillation: Secondary | ICD-10-CM

## 2019-07-24 ENCOUNTER — Encounter: Admit: 2019-07-24 | Discharge: 2019-07-24 | Payer: MEDICARE

## 2019-07-24 DIAGNOSIS — I10 Essential (primary) hypertension: Secondary | ICD-10-CM

## 2019-07-24 DIAGNOSIS — Z8673 Personal history of transient ischemic attack (TIA), and cerebral infarction without residual deficits: Secondary | ICD-10-CM

## 2019-07-24 DIAGNOSIS — I4821 Permanent atrial fibrillation: Secondary | ICD-10-CM

## 2019-07-24 DIAGNOSIS — E785 Hyperlipidemia, unspecified: Secondary | ICD-10-CM

## 2019-07-24 MED ORDER — DILTIAZEM HCL 240 MG PO CP24
240 mg | ORAL_CAPSULE | Freq: Every day | ORAL | 3 refills | 45.00000 days | Status: DC
Start: 2019-07-24 — End: 2019-07-24

## 2019-07-24 MED ORDER — DILTIAZEM HCL 240 MG PO CP24
240 mg | ORAL_CAPSULE | Freq: Every day | ORAL | 3 refills | 45.00000 days | Status: AC
Start: 2019-07-24 — End: ?

## 2019-07-24 NOTE — Progress Notes
Date of Service: 07/24/2019    TERRILEE LAVERDURE is a 79 y.o. female.       HPI     Ms. Schuchardt is followed for permanent atrial fibrillation.    When I saw her in October 2020 her blood pressure was elevated we increased her diltiazem from 120 to 180 mg daily.  She has tolerated this without difficulty and her blood pressure has been improved.  Her systolic blood pressure now runs 135 to 140 mmHg.  Her pulse is usually in the mid 70s.  This Jodoin reports no infectious or febrile symptoms during the Covid pandemic.She indicates that she has been doing well.  On Mondays Wednesdays and Fridays she does water aerobics for an hour at the [employer].  This is followed by an hour of enhanced chair aerobics for seniors. The patient reports no angina, congestive symptoms, palpitations, sensation of sustained forceful heart pounding, lightheadedness or syncope.  Her exercise tolerance has been stable. The patient reports no claudication, myalgias, bleeding abnormalities, neurologic motor abnormalities or difficulty with speech.        Vitals:    07/24/19 1052 07/24/19 1059   BP: 138/82 (!) 142/84   BP Source: Arm, Left Upper Arm, Right Upper   Patient Position: Sitting Sitting   Pulse: 69    Temp: 36.3 ?C (97.4 ?F)    TempSrc: Oral    SpO2: 97%    Weight: 86.6 kg (191 lb)    Height: 1.651 m (5' 5)    PainSc: Zero      Body mass index is 31.78 kg/m?Marland Kitchen     Past Medical History  Patient Active Problem List    Diagnosis Date Noted   ? 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    08/2017 - GI consult planned--Dr. Salvadore Farber in [place]     ? Precordial pain 08/02/2017   ? Permanent atrial fibrillation (HCC) 04/17/2014     2014 - Stroke--no known AF at that time  2015 - Presented with asymptomatic AF.  Eliquis started.  Chronic, permanent AF.  2016 - Diltiazem started for rate control     ? 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  No known etiology at that time.     ? HLD (hyperlipidemia) 09/09/2010   ? Essential hypertension 09/07/2010     January, 2012 - lisinopril started           Review of Systems   Constitution: Negative.   HENT: Positive for ear pain.    Eyes: Negative.    Cardiovascular: Negative.    Respiratory: Negative.    Endocrine: Negative.    Hematologic/Lymphatic: Negative.    Skin: Positive for dry skin.   Musculoskeletal: Positive for arthritis.   Gastrointestinal: Negative.    Genitourinary: Negative.    Neurological: Negative.    Psychiatric/Behavioral: Negative.    Allergic/Immunologic: Negative.      Physical Exam  GENERAL: The patient is well developed, well nourished, resting comfortably and in no distress.   HEENT: No abnormalities of the visible oro-nasopharynx, conjunctiva or sclera are noted.  NECK: There is no jugular venous distension. Carotids are palpable and without bruits. There is no thyroid enlargement.  Chest: Lung fields are clear to auscultation. There are no wheezes or crackles.  CV: There is an irregular rhythm variable intensity of the first heart sound.  There are no murmurs, gallops or rubs.  Her apical heart  rate is 76 bpm.  ABD: The abdomen is soft and supple with normal bowel sounds. There is no hepatosplenomegaly, ascites, tenderness, masses or bruits.  Neuro: There are no focal motor defects. Ambulation is normal. Cognitive function appears normal.  Ext: There is no edema or evidence of deep vein thrombosis. Peripheral pulses are satisfactory.    SKIN: There are no rashes and no cellulitis  PSYCH: The patient is calm, rationale and oriented.    Cardiovascular Studies A twelve-lead ECG was obtained on 04/24/2019 reveals atrial fibrillation with a rate of 74 bpm.  Left anterior fascicular block is noted along with T wave inversion in the inferior and anterior leads which is unchanged when compared to a prior ECG obtained on 07/02/2017.  Labs from August 02, 2017 revealed total cholesterol 152, triglycerides 99, HDL 50 and LDL cholesterol 87 mg/dL.  Labs from 04/24/2019 revealed serum hemoglobin 14.4 g/dL serum potassium 3.8 mmol/L and serum creatinine 0.94 mg/dL.    Echo Doppler 07/13/2019:  Left ventricular systolic function is within normal limits.  LVEF 55%  Mild right ventricular enlargement with normal RV systolic function.  TAPSE is 2.2 cm.  Moderate to severe left atrial enlargement.   Aortic valve sclerosis without stenosis.  Moderate mitral regurgitation.   No pericardial effusion.   Estimated peak systolic pulmonary artery pressure is 39 mmHg.     Heart catheterization/coronary angiography 07/04/2017:  Left ventricular systolic pressure was 130 mmHg with an end-diastolic pressure of 8 to 10 mmHg.  There was no gradient across the aortic valve on pullback maneuver. ?    SELECTIVE CORONARY ANGIOGRAPHY:  LEFT MAIN CORONARY ARTERY:  The left main coronary artery is a large caliber vessel which distally trifurcates into LAD, circumflex, and ramus.  There is no angiographically significant disease noted within the left main.   LAD:  The LAD is a medium caliber vessel which gives off 2 major diagonal branches.  The LAD system has only mild luminal irregularities present.   RAMUS:  The ramus is a medium caliber vessel which is free of angiographically significant disease.   LEFT CIRCUMFLEX:  The circumflex is a medium caliber vessel which gives off 2 small obtuse marginals.  The circumflex system has only mild luminal irregularities present. RIGHT CORONARY ARTERY:  The right coronary artery is a large caliber dominant vessel which has mild luminal irregularities within the AV groove segment.  There is no significant obstructive disease noted.  IMPRESSION:    1. Normal coronary anatomy.  2. No obstructive disease.  3. Normal left ventricular end-diastolic pressure.    Problems Addressed Today  Encounter Diagnoses   Name Primary?   ? Essential hypertension Yes   ? Permanent atrial fibrillation (HCC)      Assessment and Plan     Alternatives for the treatment of hypertension were reviewed with the patient.  She wanted to increase her diltiazem CD from 180 mg to 240 daily for control of her hypertension.  This medication should help to control her heart rate and blood pressure.  She prefers this medication because it has not been associated with any adverse effects.  Previously she developed cough in response to lisinopril.  I have asked the patient to keep a log book of her BP readings and to report BP readings exceeding 130/80 mm Hg.  I have asked her to report her blood pressure readings in 1 month regardless. I have asked her to return for follow-up in 6 months to follow her progress.  Current Medications (including today's revisions)  ? acetaminophen (TYLENOL) 500 mg tablet Take 500 mg by mouth every 6 hours as needed for Pain. Max of 4,000 mg of acetaminophen in 24 hours.   ? apixaban (ELIQUIS) 5 mg tab tablet Take 1 Tab by mouth twice daily.   ? cetirizine (ZYRTEC) 10 mg tablet Take 10 mg by mouth every morning.   ? cyanocobalamin (VITAMIN B-12, RUBRAMIN) 1,000 mcg/mL injection Inject 1,000 mcg to area(s) as directed every 30 days.   ? diclofenac sodium (PENNSAID TP) Apply  topically to affected area.   ? diltiazem CD (CARDIZEM CD) 240 mg capsule Take one capsule by mouth daily.   ? FOLIC ACID/MULTIVIT-MIN/LUTEIN (CENTRUM SILVER PO) Take 1 tablet by mouth daily.   ? oxybutynin XL (DITROPAN XL) 5 mg tablet Take 1 tablet by mouth daily. ? pantoprazole DR (PROTONIX) 20 mg tablet Take one tablet by mouth daily.   ? simvastatin (ZOCOR) 10 mg tablet Take one tablet by mouth at bedtime daily.

## 2020-02-05 ENCOUNTER — Encounter: Admit: 2020-02-05 | Discharge: 2020-02-05 | Payer: MEDICARE

## 2020-02-07 ENCOUNTER — Encounter: Admit: 2020-02-07 | Discharge: 2020-02-07 | Payer: MEDICARE

## 2020-02-07 DIAGNOSIS — I10 Essential (primary) hypertension: Secondary | ICD-10-CM

## 2020-02-07 DIAGNOSIS — I4821 Permanent atrial fibrillation: Secondary | ICD-10-CM

## 2020-02-07 DIAGNOSIS — Z8673 Personal history of transient ischemic attack (TIA), and cerebral infarction without residual deficits: Secondary | ICD-10-CM

## 2020-02-07 DIAGNOSIS — E785 Hyperlipidemia, unspecified: Secondary | ICD-10-CM

## 2020-02-07 NOTE — Progress Notes
Date of Service: 02/07/2020    Karen Horton is a 79 y.o. female.       HPI     Karen Horton is?followed for permanent atrial fibrillation. ?  When I saw her in October 2020 her blood pressure was elevated we increased her diltiazem from 120 to 180 mg daily.  She has tolerated this without difficulty and her blood pressure has been improved.  Her systolic blood pressure now runs 120 mmHg.  Her pulse is usually in the mid 70s.  Karen Horton reports no infectious or febrile symptoms during the Covid pandemic and she has received her Covid vaccine..She indicates that she has been doing well. ?On Mondays Wednesdays and Fridays she does water aerobics for an hour at the [employer]. ?This is followed by an hour of enhanced chair aerobics for seniors.  She also walks her dog on a regular basis.  The patient reports no angina, congestive symptoms, palpitations, sensation of sustained forceful heart pounding, lightheadedness or syncope.??Her exercise tolerance has been stable. The patient reports no?claudication,?myalgias, bleeding abnormalities, neurologic motor abnormalities or difficulty with speech.?       Vitals:    02/07/20 1253 02/07/20 1303   BP: 118/72 124/76   BP Source: Arm, Left Upper Arm, Right Upper   Patient Position: Sitting Sitting   Pulse: 60    SpO2: 98%    Weight: 87.9 kg (193 lb 12.8 oz)    Height: 1.651 m (5' 5)    PainSc: Zero      Body mass index is 32.25 kg/m?Marland Kitchen     Past Medical History  Patient Active Problem List    Diagnosis Date Noted   ? 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    08/2017 - GI consult planned--Dr. Salvadore Farber in [place]     ? Precordial pain 08/02/2017   ? Permanent atrial fibrillation (HCC) 04/17/2014     2014 - Stroke--no known AF at that time  2015 - Presented with asymptomatic AF.  Eliquis started.  Chronic, permanent AF.  2016 - Diltiazem started for rate control     ? 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  No known etiology at that time.     ? HLD (hyperlipidemia) 09/09/2010   ? Essential hypertension 09/07/2010     January, 2012 - lisinopril started           Review of Systems   Constitution: Positive for malaise/fatigue.   HENT: Negative.    Eyes: Negative.    Respiratory: Positive for cough.    Endocrine: Positive for polyuria.   Hematologic/Lymphatic: Negative.    Skin: Positive for suspicious lesions.   Musculoskeletal: Positive for back pain, joint pain, joint swelling and myalgias.   Gastrointestinal: Negative.    Genitourinary: Positive for frequency.   Neurological: Negative.    Psychiatric/Behavioral: Negative.    Allergic/Immunologic: Positive for environmental allergies.       Physical Exam  GENERAL: The patient is well developed, well nourished, resting comfortably and in no distress.   HEENT: No abnormalities of the visible oro-nasopharynx, conjunctiva or sclera are noted.  NECK: There is no jugular venous distension. Carotids are palpable and without bruits. There is no thyroid enlargement.  Chest: Lung fields are clear to auscultation. There are no wheezes or crackles.  CV: There is an irregular rhythm?variable intensity of the first heart sound.??There are no murmurs, gallops or  rubs.  Her apical heart rate is 64 bpm.  ABD: The abdomen is soft and supple with normal bowel sounds. There is no hepatosplenomegaly, ascites, tenderness, masses or bruits.  Neuro: There are no focal motor defects. Ambulation is normal. Cognitive function appears normal.  Ext:?There is no edema or evidence of deep vein thrombosis. Peripheral pulses are satisfactory. ?  SKIN:?There are no rashes and no cellulitis  PSYCH:?The patient is calm, rationale and oriented.    Cardiovascular Studies  A twelve-lead ECG was obtained on 04/24/2019 reveals atrial fibrillation with a rate of 74 bpm. 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 ?Labs from August 02, 2017 revealed total cholesterol 152, triglycerides 99, HDL 50 and?LDL cholesterol 87 mg/dL.  Labs from 04/24/2019 revealed serum hemoglobin 14.4 g/dL serum potassium 3.8 mmol/L and serum creatinine 0.94 mg/dL.  Labs from 10/12/2019 revealed serum hemoglobin 13.8 g%, serum creatinine 0.82 mg/dL, serum potassium 3.8 mmol/L, ALT = 16.    Echo Doppler 07/13/2019:  Left ventricular systolic function is within normal limits.  LVEF 55%  Mild right ventricular enlargement with normal RV systolic function. ?TAPSE is 2.2 cm.  Moderate to severe left atrial enlargement.   Aortic valve sclerosis without stenosis.  Moderate mitral regurgitation.   No pericardial effusion.   Estimated peak systolic pulmonary artery pressure is 39 mmHg.   ?  Heart catheterization/coronary angiography 07/04/2017:  Left ventricular systolic pressure was 130 mmHg with an end-diastolic pressure of 8 to 10 mmHg. ?There was no gradient across the aortic valve on pullback maneuver. ?  ?  SELECTIVE CORONARY ANGIOGRAPHY:??LEFT MAIN CORONARY ARTERY: ?The left main coronary artery is a large caliber vessel which distally trifurcates into LAD, circumflex, and ramus. ?There is no angiographically significant disease noted within the left main.   LAD: ?The LAD is a medium caliber vessel which gives off 2 major diagonal branches. ?The LAD system has only mild luminal irregularities present.   RAMUS: ?The ramus is a medium caliber vessel which is free of angiographically significant disease.   LEFT CIRCUMFLEX: ?The circumflex is a medium caliber vessel which gives off 2 small obtuse marginals. ?The circumflex system has only mild luminal irregularities present.   RIGHT CORONARY ARTERY: ?The right coronary artery is a large caliber dominant vessel which has mild luminal irregularities within the AV groove segment. ?There is no significant obstructive disease noted.  IMPRESSION:??  1. Normal coronary anatomy.  2. No obstructive disease.  3. Normal left ventricular end-diastolic pressure  Problems Addressed Today  Hypertension.  His permanent atrial fibrillation.  Assessment and Plan: Ms. Blough indicates that she is doing well and reports no chest discomfort or congestive symptoms.  She is tolerating anticoagulation without difficulty and her heart rate is well controlled.  She is tolerating statin therapy without difficulty. The risks and benefits of anticoagulation therapy have been reviewed with the patient. The patient understands that anticoagulation is used to decrease thrombotic or clotting complications associated with atrial fibrillation/flutter, such as stroke and systemic embolization which can be disabling or fatal, but can, on occasion, lead to life-threatening bleeding complications including gastrointestinal or intracranial hemorrhage. The patient wishes to continue anticoagulation with apixaban.  Her CHA2DS2-VASc score appears to be 4 for age (2 points), gender and hypertension.  I have asked her to return for follow-up in 6 months time.  Current Medications (including today's revisions)  ? acetaminophen (TYLENOL) 500 mg tablet Take 500 mg by mouth every 6 hours as needed for Pain. Max of 4,000 mg of acetaminophen in 24 hours.   ? apixaban (ELIQUIS) 5 mg tab tablet Take 1 Tab by mouth twice daily.   ? cetirizine (ZYRTEC) 10 mg tablet Take 10 mg by mouth every morning.   ? cholecalciferol (VITAMIN D-3) 5000 unit tablet Take 1 tablet by mouth daily.   ? cyanocobalamin (VITAMIN B-12, RUBRAMIN) 1,000 mcg/mL injection Inject 1,000 mcg to area(s) as directed every 30 days.   ? diclofenac sodium (PENNSAID TP) Apply  topically to affected area as Needed.   ? diltiazem CD (CARDIZEM CD) 240 mg capsule Take one capsule by mouth daily.   ? FOLIC ACID/MULTIVIT-MIN/LUTEIN (CENTRUM SILVER PO) Take 1 tablet by mouth daily.   ? oxybutynin XL (DITROPAN XL) 5 mg tablet Take 1 tablet by mouth daily.   ? pantoprazole DR (PROTONIX) 20 mg tablet Take one tablet by mouth daily.   ? simvastatin (ZOCOR) 20 mg tablet Take 1 tablet by mouth daily.   ? vitamin E 400 unit capsule Take 400 Units by mouth daily.

## 2020-05-23 IMAGING — MG MAMMOGRAM 3D SCREEN, BILATERAL
11 of 16 series · 11 of 16 positions shown · non-contrast
Comparison: none

[R CC (1 of 2)]
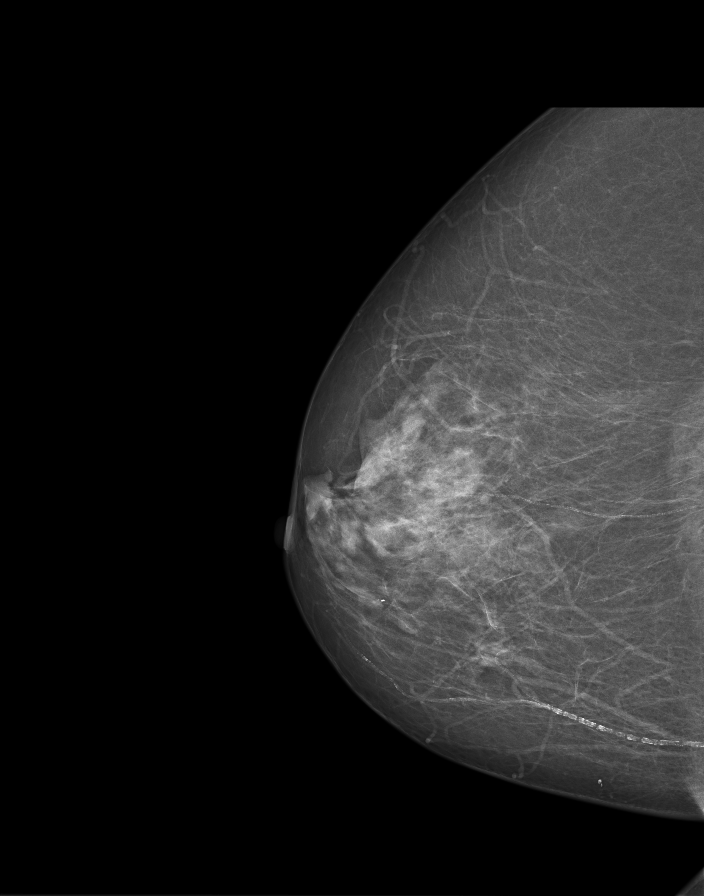

[R tomo (1 of 2)]
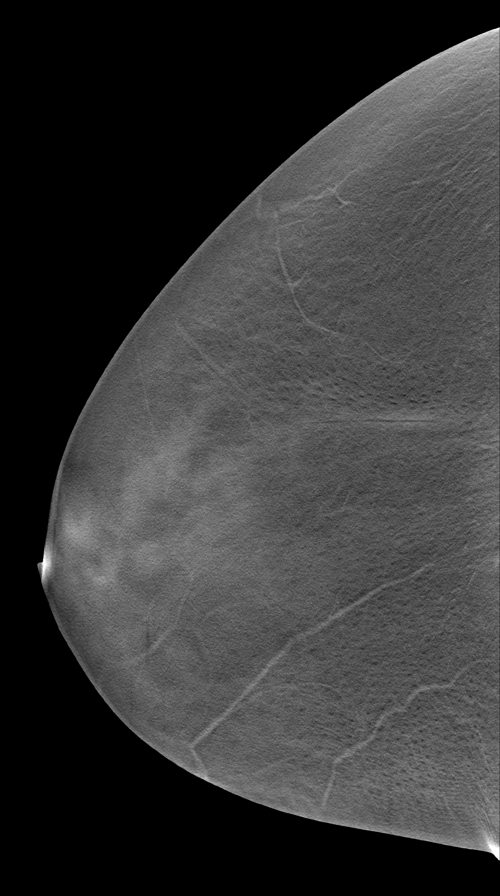

[R CC (2 of 2)]
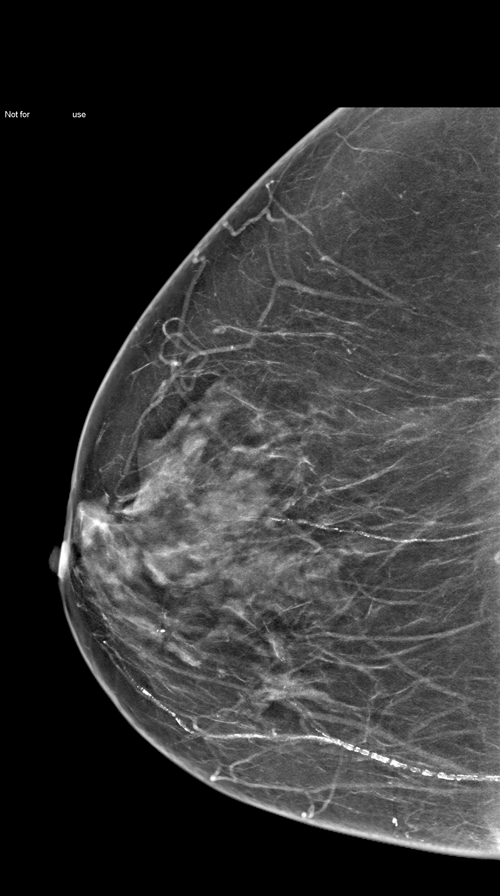

[R]
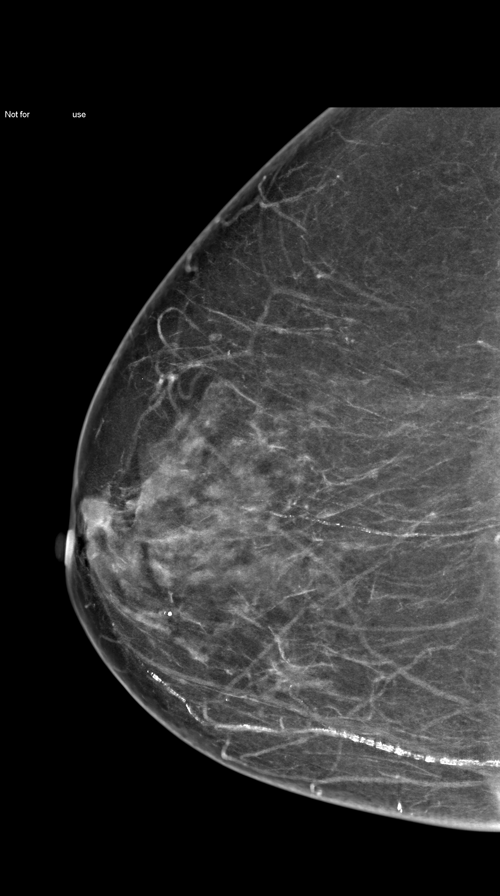

[L CC (1 of 2)]
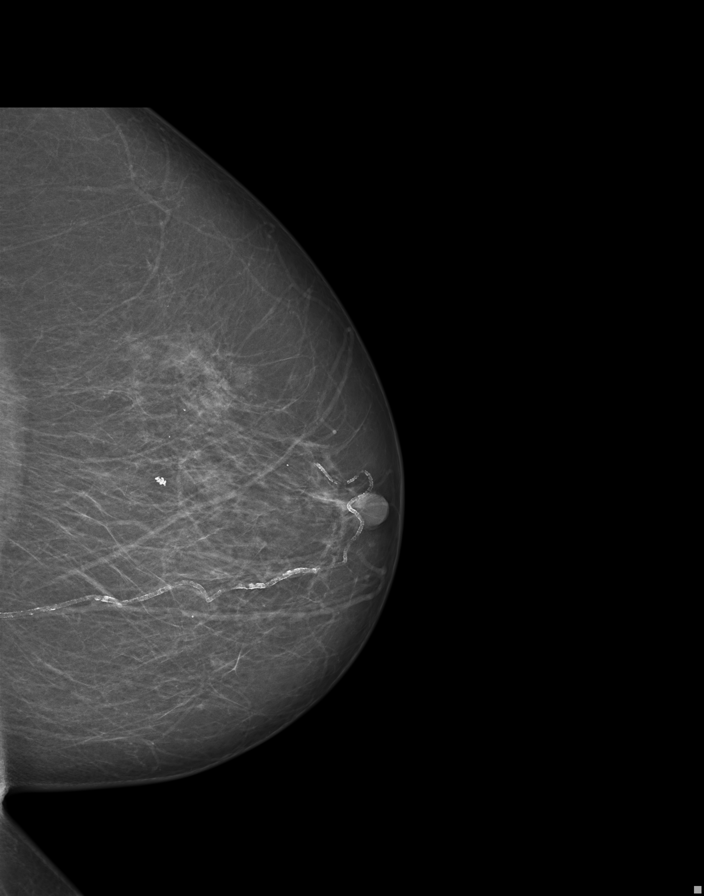

[L tomo]
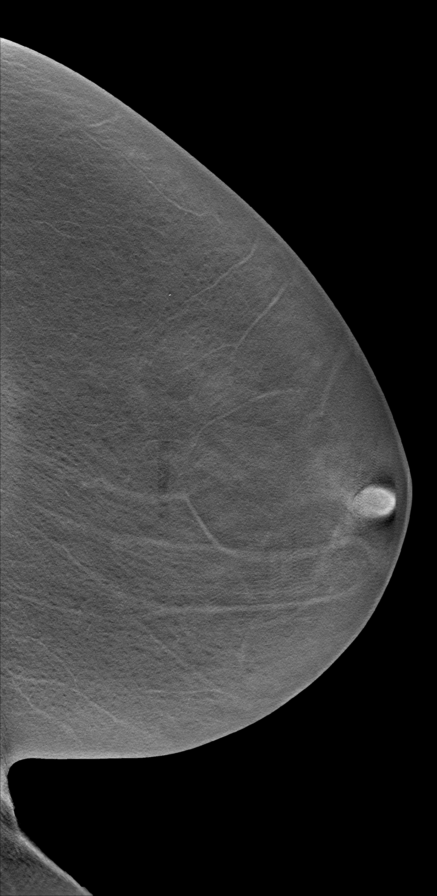

[L CC (2 of 2)]
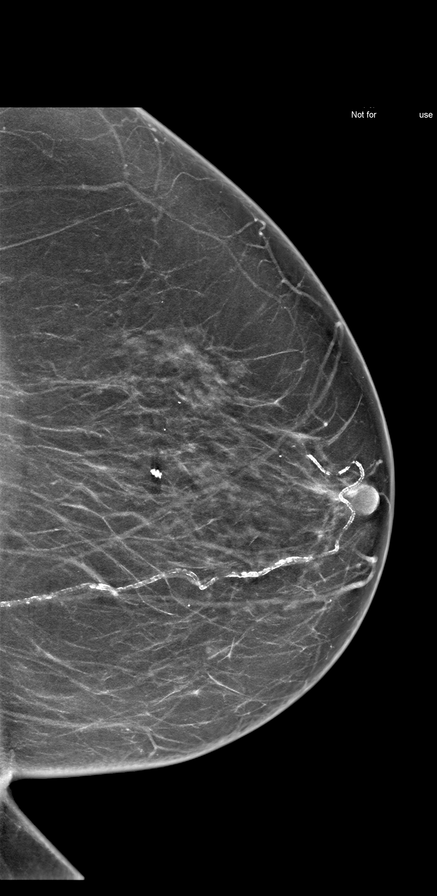

[L]
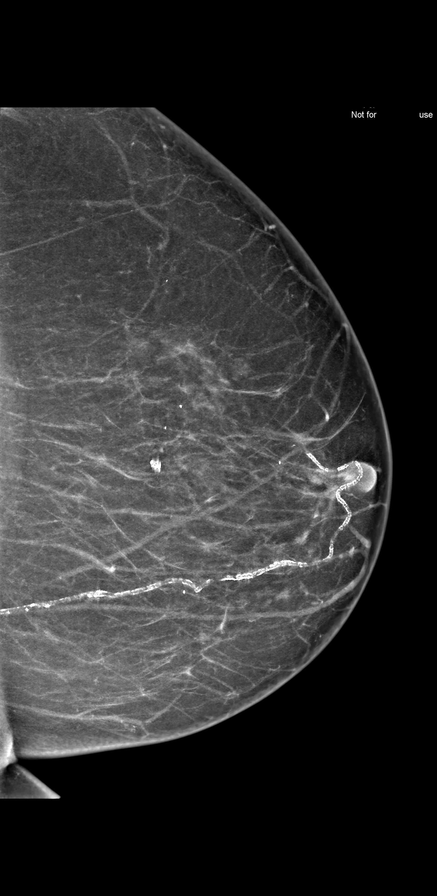

[R MLO (1 of 2)]
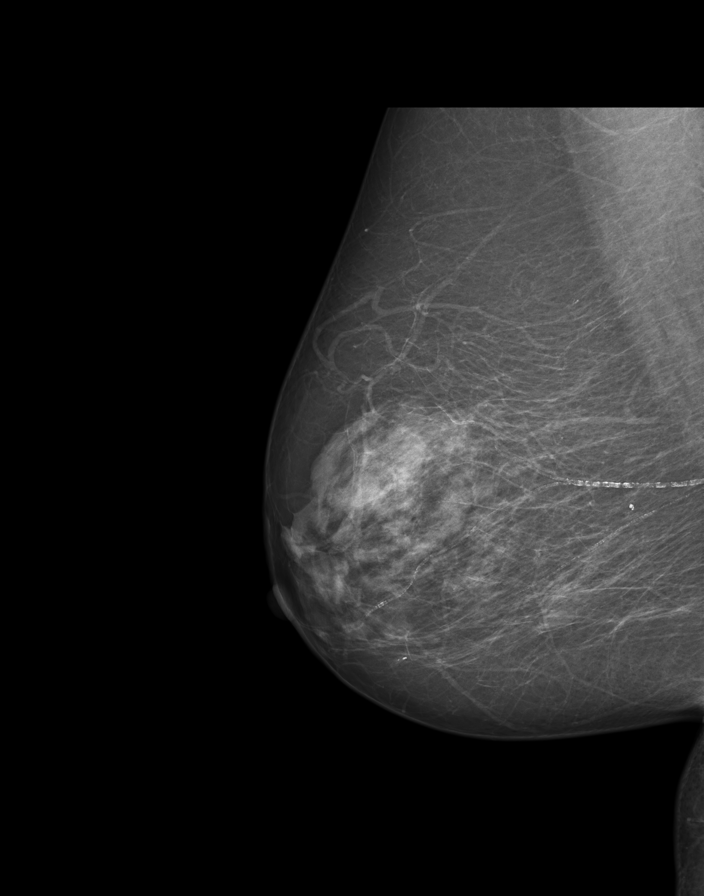

[R tomo (2 of 2)]
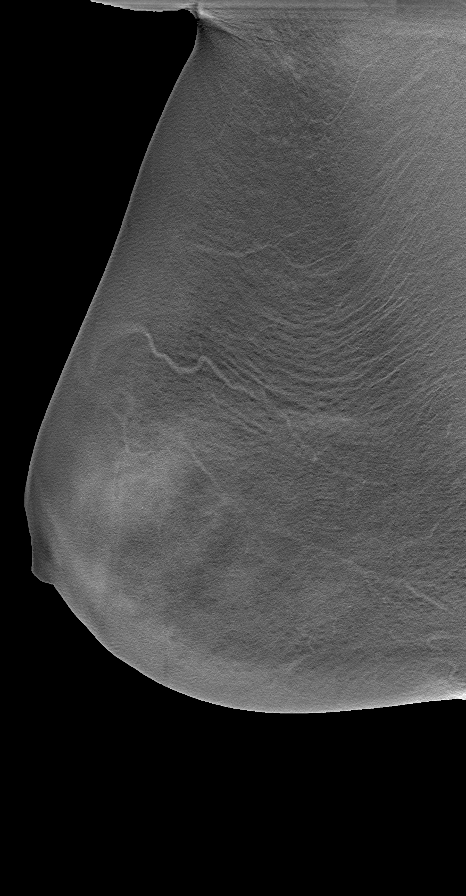

[R MLO (2 of 2)]
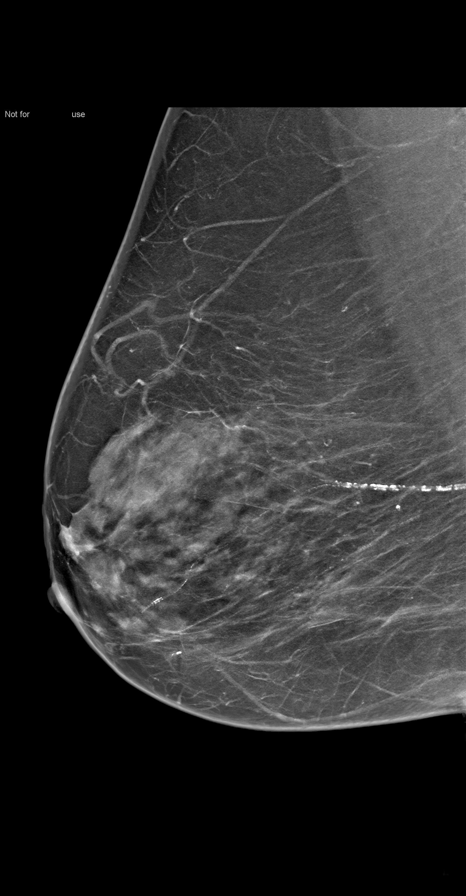

[11 of 16 positions shown; findings below may reference images not displayed]

EXAM

3D SCREENING MAMMOGRAM, BILATERAL

INDICATION

screening
HX. BENIGN SX BXs ON RT BREAST 0996   0338.  SCREENING.  AB (3D) PRIORS: [id number].

TECHNIQUE

Digital 2D CC and MLO projections obtained with 3D tomographic views per manufacturer's protocol.
ICAD version 7.2 was used during this exam.

COMPARISONS

May 23, 2019

FINDINGS

ACR Type 2:  25-50% There are scattered fibroglandular densities.

Scattered unchanged calcifications and small densities are noted. No concerning changes seen.

IMPRESSION

Stable mammograms. One year follow-up is recommended.

BI-RADS 2, BENIGN.

Tech Notes:

## 2020-08-27 IMAGING — CR SHOULDCMRT
1 series · 1 of 1 positions shown · non-contrast
Comparison: none

[shoulder internal]
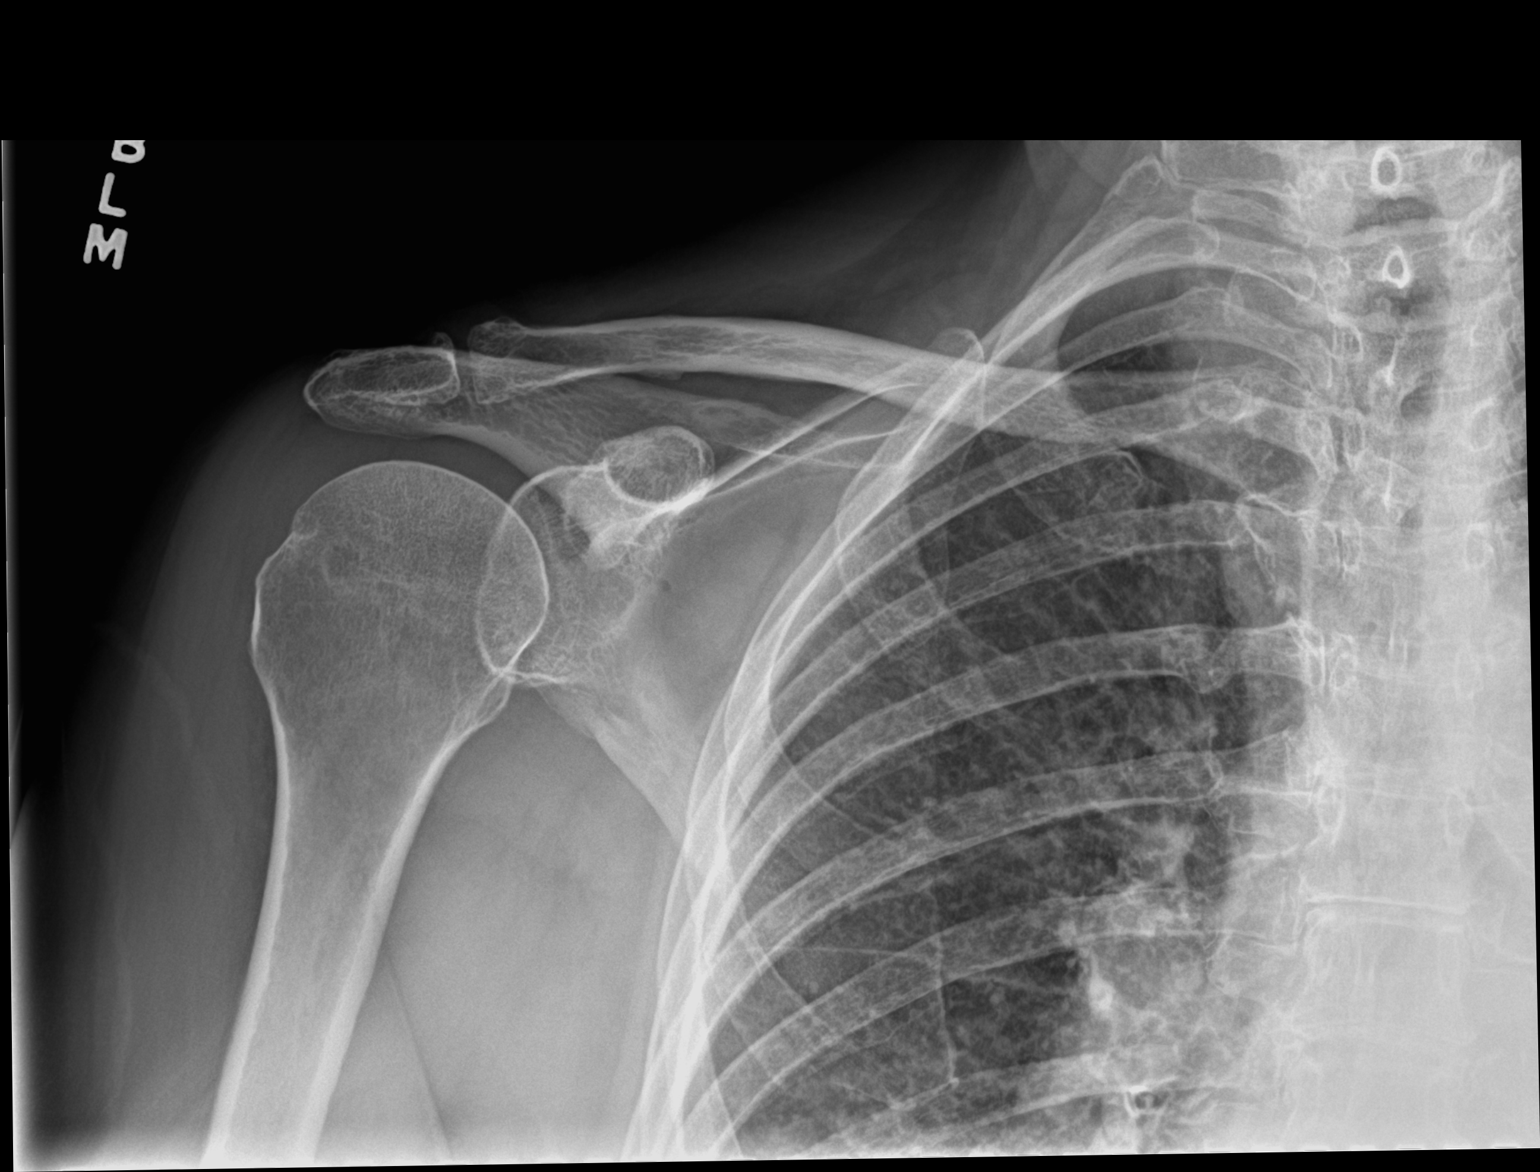

[1 of 1 positions shown; findings below may reference images not displayed]

EXAM

RADIOLOGICAL EXAMINATION SHOULDER; COMPLETE, MINIMUM 2 VIEWS CPT 77878

INDICATION

Patient complains of right shoulder pain. Patient states the pain has been occuring off and on for
a couple years. NKI. Denies surgical history. BM/HS

TECHNIQUE

Four views of the  right shoulder were acquired.

COMPARISONS

There are no previous examinations available for comparison at the time of dictation.

FINDINGS

AP internal, AP external, axillary and Y views of the  right shoulder show no acute fracture or
dislocation. AC joint appears aligned with mild degenerative change. There is mild osteophytic
spurring of the glenohumeral joint noted as well. Adjacent ribs appear normal. Soft tissues appear
normal.

IMPRESSION

No fracture or dislocation of the right shoulder. Mild degenerative changes of the glenohumeral and
AC joint are noted.

Tech Notes:

Patient c/o right shoulder pain. Patient states the pain has been occuring off and on for a couple
years. NKI. Denies sx hx. BM/HS

## 2020-10-13 ENCOUNTER — Encounter: Admit: 2020-10-13 | Discharge: 2020-10-13 | Payer: MEDICARE

## 2020-10-13 MED ORDER — DILTIAZEM HCL 240 MG PO CP24
240 mg | ORAL_CAPSULE | Freq: Every day | ORAL | 3 refills | 45.00000 days | Status: AC
Start: 2020-10-13 — End: ?

## 2020-10-13 NOTE — Telephone Encounter
Received a request via computer from the patients pharmacy requesting a refill.  Script e-scribed as requested.

## 2020-10-30 IMAGING — CR SPLUMBLM
1 series · 1 of 1 positions shown · non-contrast
Comparison: none

[lspine ap]
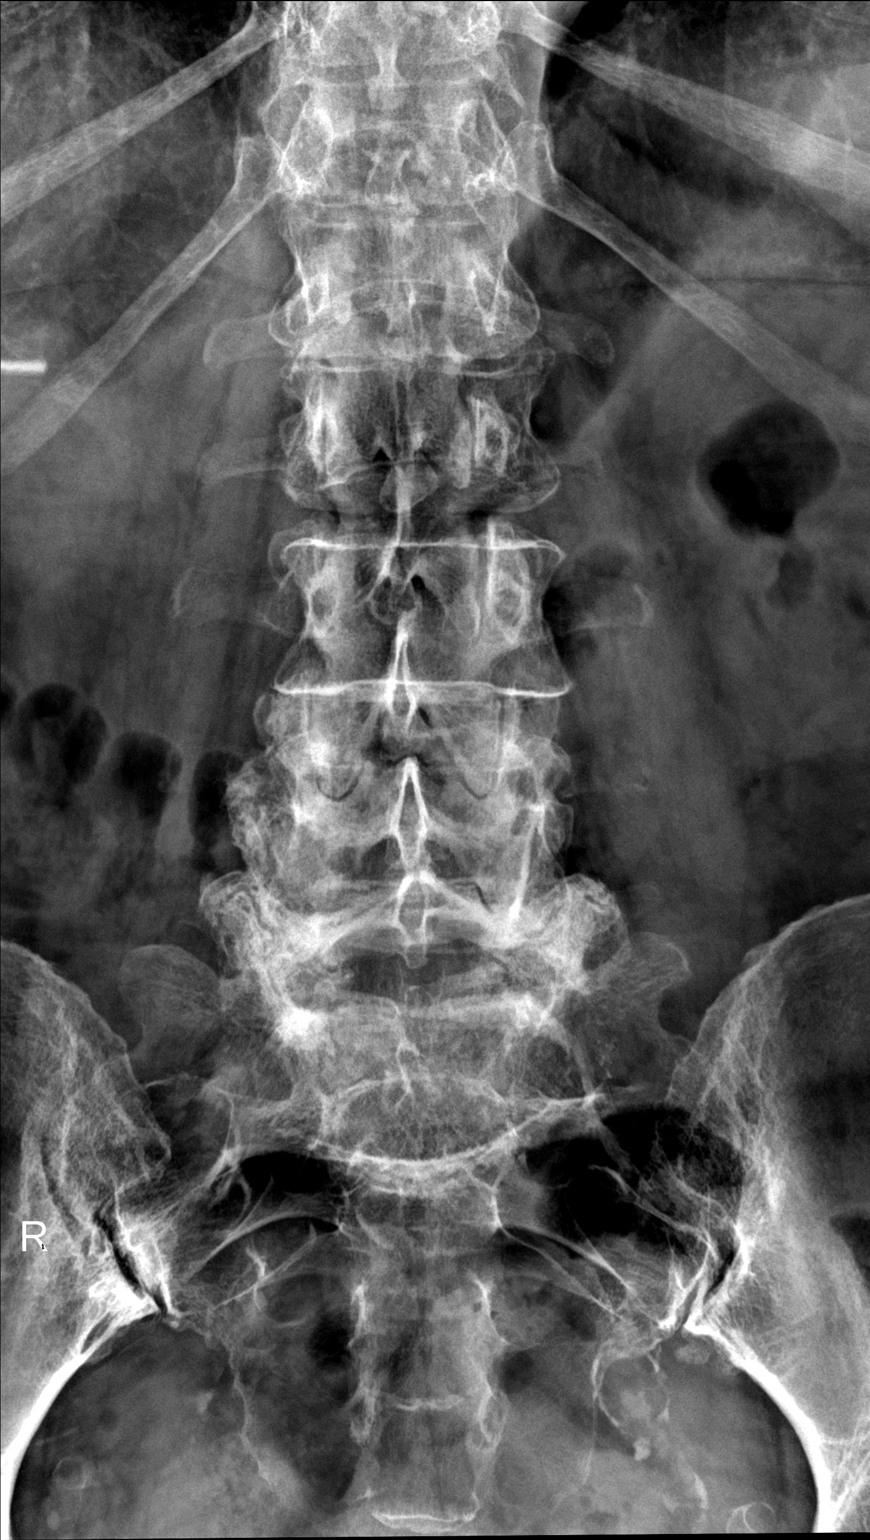

[1 of 1 positions shown; findings below may reference images not displayed]

DIAGNOSTIC STUDIES

EXAM

XR lumbar spine 2-3V

INDICATION

sciatic pain with radiculopathy (left sided)
PAIN TO LEFT HIP AND JUST BELOW THE BUTTOCKS ON LEFT SIDE, RADIATION DOWN LEFT LEG.  RG

TECHNIQUE

XR lumbar spine 2-3V

COMPARISONS

None

FINDINGS

There is accented  lumbar lordosis. Partial lumbarization of the S1 vertebral body. Vertebral body
heights are maintained. Intervertebral disc spaces are preserved. No listhesis. Moderate to severe
facet arthropathy.

IMPRESSION

Severe facet arthropathy.

Tech Notes:

PAIN TO LEFT HIP AND JUST BELOW THE BUTTOCKS ON LEFT SIDE, RADIATION DOWN LEFT LEG.  RG

## 2020-10-30 IMAGING — CR HIPPELBI
4 series · 4 of 4 positions shown · non-contrast
Comparison: none

[hip ap pelvis]
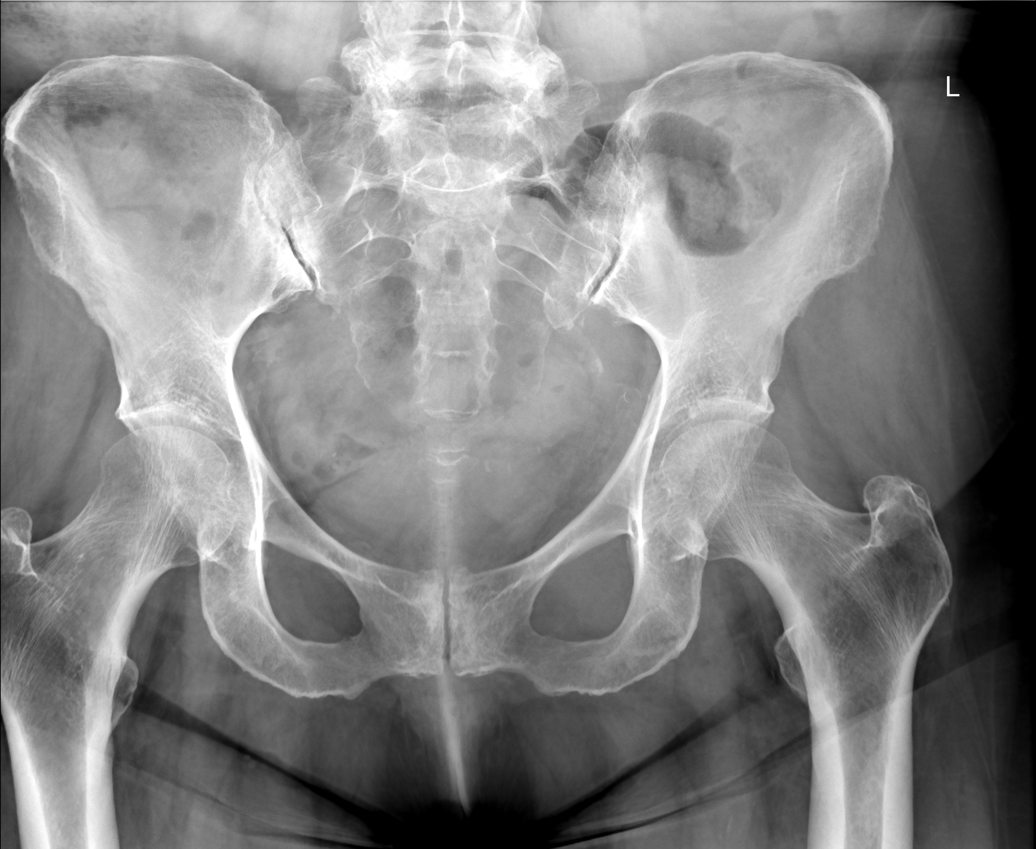

[hip ap (1 of 2)]
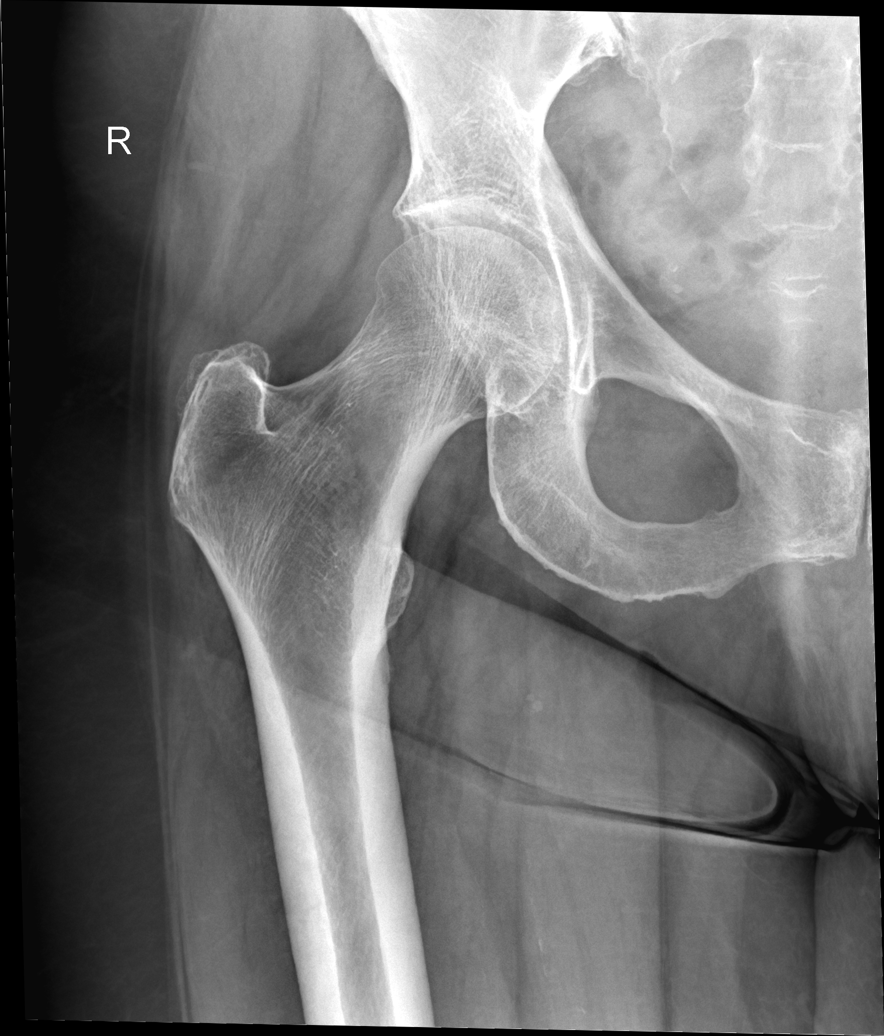

[hip ap (2 of 2)]
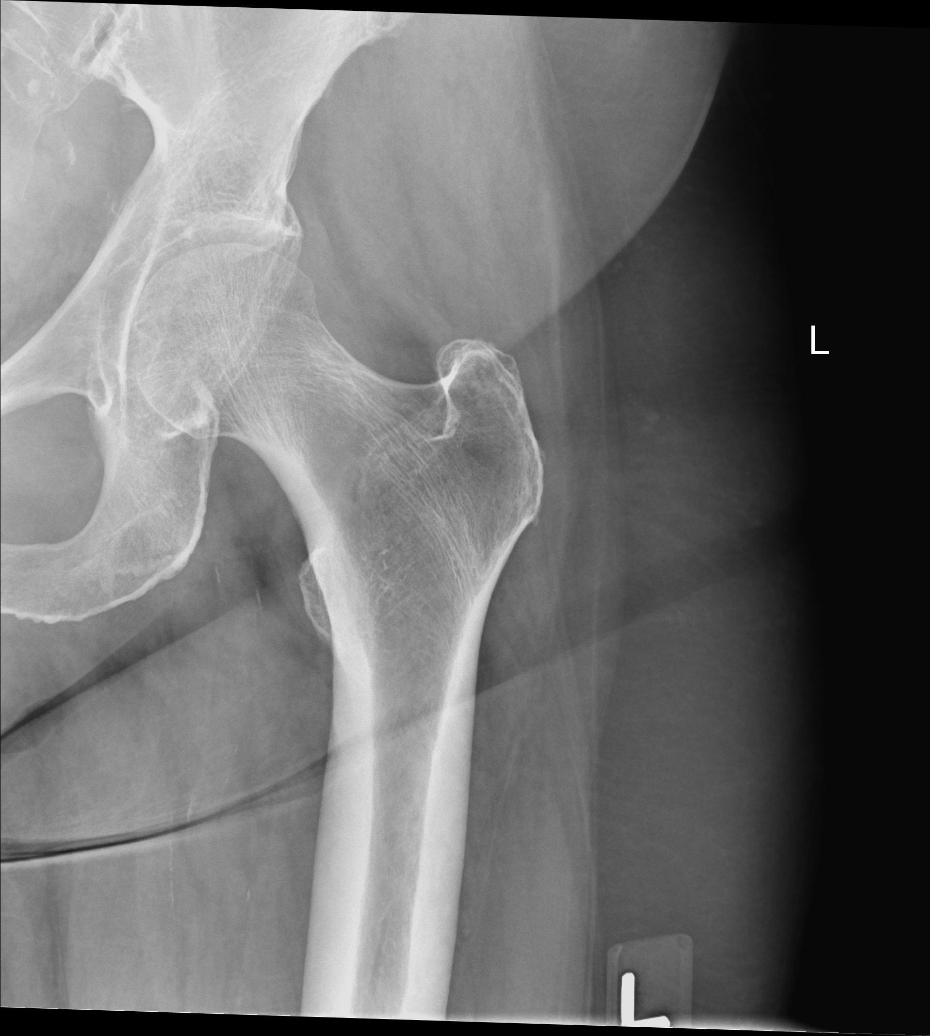

[hip frog lat]
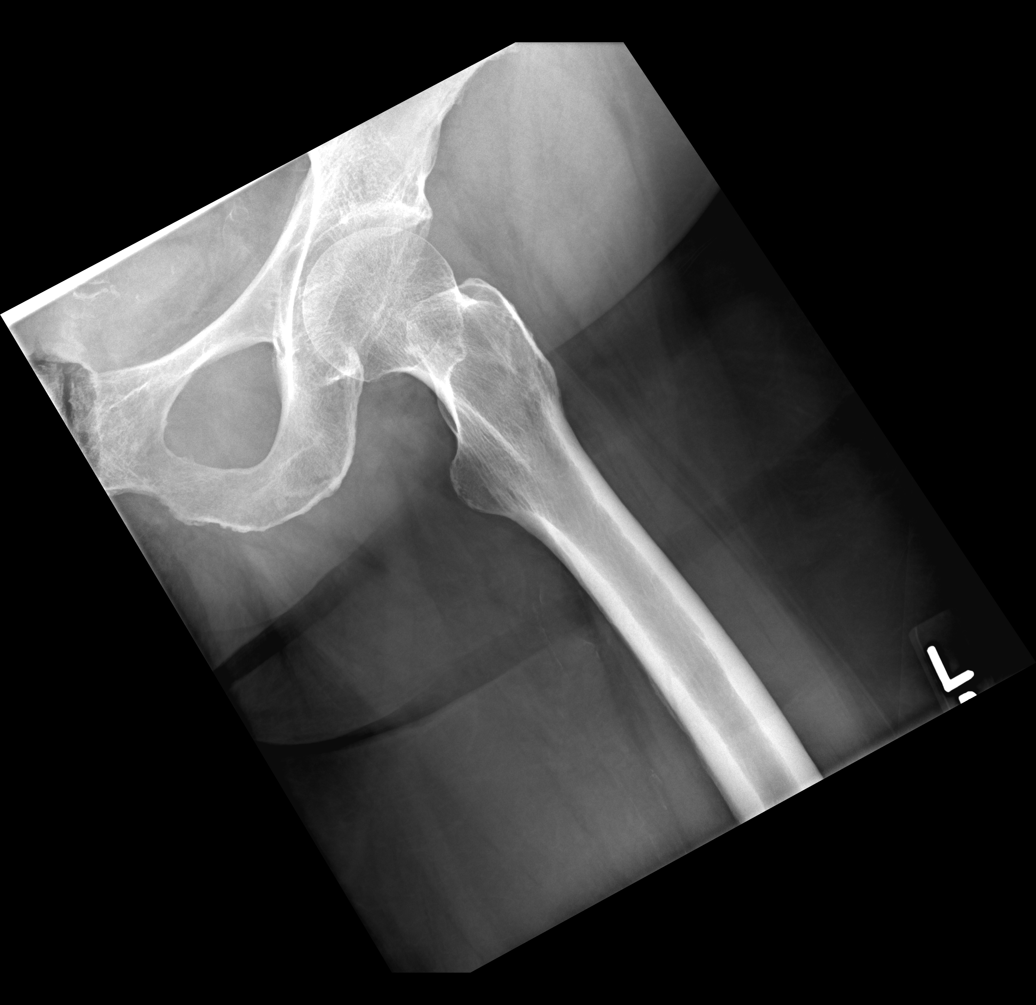

[4 of 4 positions shown; findings below may reference images not displayed]

DIAGNOSTIC STUDIES

EXAM

XR hip BI w BX160

INDICATION

hip pain
PAIN TO LEFT HIP AND JUST BELOW THE BUTTOCKS ON LEFT SIDE, RADIATION DOWN LEFT LEG.  RG

TECHNIQUE

XR hip BI w BX160

COMPARISONS

None

FINDINGS

No acute fracture or dislocation. Severe bilateral sacroiliac joint osteoarthritis. There is no
degenerative change within the hips bilaterally. Mild sclerosis of the pubic symphysis.. Overlying
soft tissues are within normal limits.

IMPRESSION

No acute osseous findings.

Severe bilateral SI joint osteoarthritis.

Tech Notes:

PAIN TO LEFT HIP AND JUST BELOW THE BUTTOCKS ON LEFT SIDE, RADIATION DOWN LEFT LEG.  RG

## 2020-11-06 ENCOUNTER — Encounter: Admit: 2020-11-06 | Discharge: 2020-11-06 | Payer: MEDICARE

## 2020-11-11 ENCOUNTER — Encounter: Admit: 2020-11-11 | Discharge: 2020-11-11 | Payer: MEDICARE

## 2020-11-11 DIAGNOSIS — Z8673 Personal history of transient ischemic attack (TIA), and cerebral infarction without residual deficits: Secondary | ICD-10-CM

## 2020-11-11 DIAGNOSIS — I10 Essential (primary) hypertension: Secondary | ICD-10-CM

## 2020-11-11 DIAGNOSIS — E785 Hyperlipidemia, unspecified: Secondary | ICD-10-CM

## 2020-11-11 DIAGNOSIS — I4821 Permanent atrial fibrillation: Secondary | ICD-10-CM

## 2020-11-11 NOTE — Progress Notes
Date of Service: 11/11/2020    Karen Horton is a 80 y.o. female.       HPI     Karen Horton is?followed for permanent atrial fibrillation.? Blood pressures been very well controlled over the past 6 months. ?Karen Horton reports no infectious or febrile symptoms during the Covid pandemic and she has received her Covid vaccines..She indicates that she has been doing well. ?2 days a week she does water aerobics for an hour at the local [employer]. ?On the other 3 days a week she does aerobic exercises at the [facility] followed by stretching exercises each for 30 minutes. She also walks her dog on a regular basis.  The patient reports no angina, congestive symptoms, palpitations, sensation of sustained forceful heart pounding, lightheadedness or syncope.??Her exercise tolerance has been stable. The patient reports no?claudication,?myalgias, bleeding abnormalities, neurologic motor abnormalities or difficulty with speech.? No emergency room visits or hospitalizations or reported in recent months.       Vitals:    11/11/20 1419   BP: 126/72   BP Source: Arm, Left Upper   Pulse: 50   SpO2: 98%   O2 Device: None (Room air)   PainSc: Zero   Weight: 89.8 kg (198 lb)   Height: 165.1 cm (5' 5)     Body mass index is 32.95 kg/m?Marland Kitchen     Past Medical History  Patient Active Problem List    Diagnosis Date Noted   ? 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    08/2017 - GI consult planned--Dr. Salvadore Farber in [place]     ? Precordial pain 08/02/2017   ? Permanent atrial fibrillation (HCC) 04/17/2014     2014 - Stroke--no known AF at that time  2015 - Presented with asymptomatic AF.  Eliquis started.  Chronic, permanent AF.  2016 - Diltiazem started for rate control     ? 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  No known etiology at that time.     ? HLD (hyperlipidemia) 09/09/2010   ? Essential hypertension 09/07/2010     January, 2012 - lisinopril started Review of Systems   Constitutional: Negative.   HENT: Negative.    Eyes: Negative.    Cardiovascular: Positive for dyspnea on exertion.   Endocrine: Negative.    Hematologic/Lymphatic: Negative.    Skin: Negative.    Musculoskeletal: Positive for arthritis.   Gastrointestinal: Negative.    Genitourinary: Negative.    Neurological: Negative.    Psychiatric/Behavioral: Negative.    Allergic/Immunologic: Negative.        Physical Exam  GENERAL: The patient is well developed, well nourished, resting comfortably and in no distress.   HEENT: No abnormalities of the visible oro-nasopharynx, conjunctiva or sclera are noted.  NECK: There is no jugular venous distension. Carotids are palpable and without bruits. There is no thyroid enlargement.  Chest: Lung fields are clear to auscultation. There are no wheezes or crackles.  CV: There is an irregular rhythm?with variable intensity of the first heart sound.??There are no murmurs, gallops or rubs.??Her apical heart rate is 60 bpm.  ABD: The abdomen is soft and supple with normal bowel sounds. There is no hepatosplenomegaly, ascites, tenderness, masses or bruits.  Neuro: There are no focal motor defects. Ambulation is normal. Cognitive function appears normal.  Ext:?There is no edema or evidence of deep vein thrombosis. Peripheral pulses are satisfactory. ?  SKIN:?There are  no rashes and no cellulitis  PSYCH:?The patient is calm, rationale and oriented.    Cardiovascular Studies  A twelve-lead ECG was obtained on 11/11/2020 and reveals atrial fibrillation with a heart rate of 61 bpm.  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  Labs from 10/14/2020 revealed serum potassium 4.6 mmol/L and serum creatinine 0.4 mg/dL.  Her TSH was 2.22.  Her ALT = 15.    Echo Doppler 07/13/2019:  Left ventricular systolic function is within normal limits.  LVEF 55%  Mild right ventricular enlargement with normal RV systolic function. ?TAPSE is 2.2 cm.  Moderate to severe left atrial enlargement.   Aortic valve sclerosis without stenosis.  Moderate mitral regurgitation.   No pericardial effusion.   Estimated peak systolic pulmonary artery pressure is 39 mmHg.?  ?  Heart catheterization/coronary angiography 07/04/2017:  Left ventricular systolic pressure was 130 mmHg with an end-diastolic pressure of 8 to 10 mmHg. ?There was no gradient across the aortic valve on pullback maneuver. ?  ?  SELECTIVE CORONARY ANGIOGRAPHY:??LEFT MAIN CORONARY ARTERY: ?The left main coronary artery is a large caliber vessel which distally trifurcates into LAD, circumflex, and ramus. ?There is no angiographically significant disease noted within the left main.   LAD: ?The LAD is a medium caliber vessel which gives off 2 major diagonal branches. ?The LAD system has only mild luminal irregularities present.   RAMUS: ?The ramus is a medium caliber vessel which is free of angiographically significant disease.   LEFT CIRCUMFLEX: ?The circumflex is a medium caliber vessel which gives off 2 small obtuse marginals. ?The circumflex system has only mild luminal irregularities present.   RIGHT CORONARY ARTERY: ?The right coronary artery is a large caliber dominant vessel which has mild luminal irregularities within the AV groove segment. ?There is no significant obstructive disease noted.  IMPRESSION:??  1. Normal coronary anatomy.  2. No obstructive disease.  3. Normal left ventricular end-diastolic pressure    Cardiovascular Health Factors  Vitals BP Readings from Last 3 Encounters:   11/11/20 126/72   02/07/20 124/76   07/24/19 (!) 142/84     Wt Readings from Last 3 Encounters:   11/11/20 89.8 kg (198 lb)   02/07/20 87.9 kg (193 lb 12.8 oz)   07/24/19 86.6 kg (191 lb)     BMI Readings from Last 3 Encounters:   11/11/20 32.95 kg/m?   02/07/20 32.25 kg/m?   07/24/19 31.78 kg/m?      Smoking Social History     Tobacco Use   Smoking Status Former Smoker   ? Years: 5.00   ? Quit date: 07/05/1962 ? Years since quitting: 58.3   Smokeless Tobacco Never Used      Lipid Profile Cholesterol   Date Value Ref Range Status   10/14/2020 167  Final     HDL   Date Value Ref Range Status   10/14/2020 71  Final     LDL   Date Value Ref Range Status   10/14/2020 85  Final     Triglycerides   Date Value Ref Range Status   10/14/2020 54  Final      Blood Sugar Hemoglobin A1C   Date Value Ref Range Status   06/30/2017 5.5 4.0 - 6.0 % Final     Comment:     The ADA recommends that most patients with type 1 and type 2 diabetes maintain   an A1c level <7%.       Glucose  Date Value Ref Range Status   10/14/2020 91  Final   10/12/2019 106 (H) 70 - 105 Final   05/02/2019 103  Final          Problems Addressed Today  Encounter Diagnoses   Name Primary?   ? Permanent atrial fibrillation (HCC) Yes     Assessment and Plan     Ms. Lacroix indicates that she is doing well and reports no chest discomfort or congestive symptoms.  She is tolerating anticoagulation without difficulty and her heart rate is well controlled.  She is tolerating statin therapy without difficulty. The risks and benefits of anticoagulation therapy have been reviewed with the patient. The patient understands that anticoagulation is used to decrease thrombotic or clotting complications associated with atrial fibrillation/flutter, such as stroke and systemic embolization which can be disabling or fatal, but can, on occasion, lead to life-threatening bleeding complications including gastrointestinal or intracranial hemorrhage. The patient wishes to continue anticoagulation with apixaban.  Her CHA2DS2-VASc score appears to be 4 for age (2 points), gender and hypertension.    I congratulated her on her excellent exercise program.  I have asked her to return for follow-up in 6 months time.         Current Medications (including today's revisions)  ? acetaminophen (TYLENOL) 500 mg tablet Take 500 mg by mouth every 6 hours as needed for Pain. Max of 4,000 mg of acetaminophen in 24 hours.   ? apixaban (ELIQUIS) 5 mg tab tablet Take 1 Tab by mouth twice daily.   ? cetirizine (ZYRTEC) 10 mg tablet Take 10 mg by mouth every morning.   ? cholecalciferol (VITAMIN D-3) 5000 unit tablet Take 1 tablet by mouth daily.   ? cyanocobalamin (VITAMIN B-12, RUBRAMIN) 1,000 mcg/mL injection Inject 1,000 mcg to area(s) as directed every 30 days.   ? diclofenac sodium (PENNSAID TP) Apply  topically to affected area as Needed.   ? diltiazem CD (CARDIZEM CD) 240 mg capsule Take 1 capsule by mouth once daily   ? FLUoxetine (PROZAC) 10 mg capsule Take 1 capsule by mouth daily.   ? FOLIC ACID/MULTIVIT-MIN/LUTEIN (CENTRUM SILVER PO) Take 1 tablet by mouth daily.   ? oxybutynin XL (DITROPAN XL) 5 mg tablet Take 1 tablet by mouth daily.   ? pantoprazole DR (PROTONIX) 20 mg tablet Take one tablet by mouth daily. (Patient taking differently: Take 40 mg by mouth daily.)   ? simvastatin (ZOCOR) 20 mg tablet Take 1 tablet by mouth daily.   ? traZODone (DESYREL) 50 mg tablet Take 1 tablet by mouth daily.   ? vitamin E 400 unit capsule Take 400 Units by mouth daily.

## 2021-01-18 IMAGING — CT BRAIN WO(Adult)
3 of 4 series · 14 of 47 positions shown, 16 images · non-contrast
Comparison: none

[Series 4: brain cor 5.00 hr40 s3 · coronal · 0.31mm/px · 3 of 41 slices shown]
[im 14/41  brain]
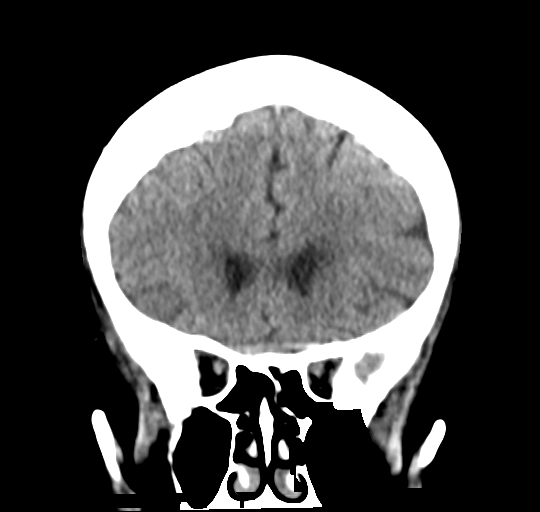
[im 18/41  brain]
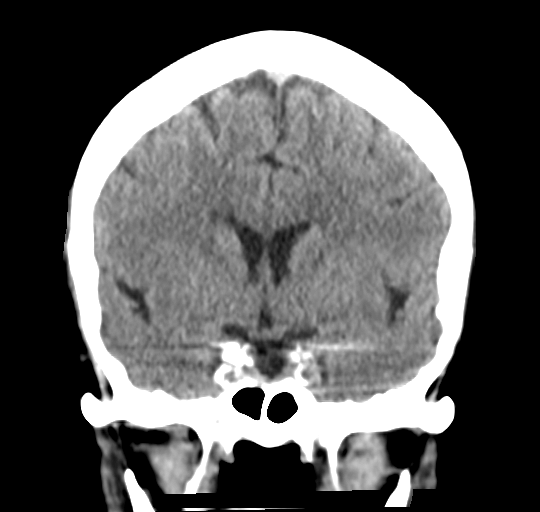
[im 23/41  brain]
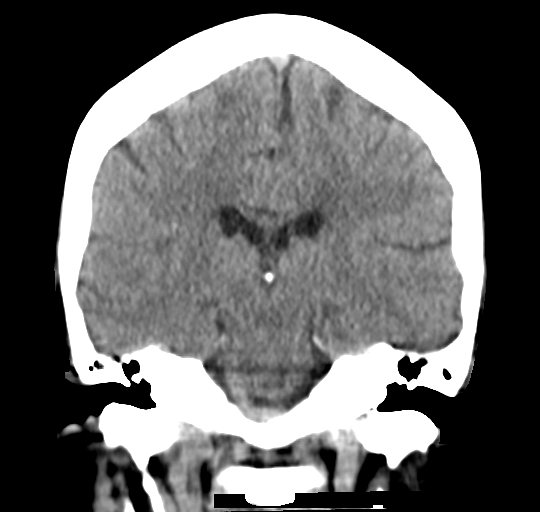

[Series 6: brain sag 5.00 hr40 s3 · sagittal · 0.31mm/px · 3 of 33 slices shown]
[im 11/33  brain]
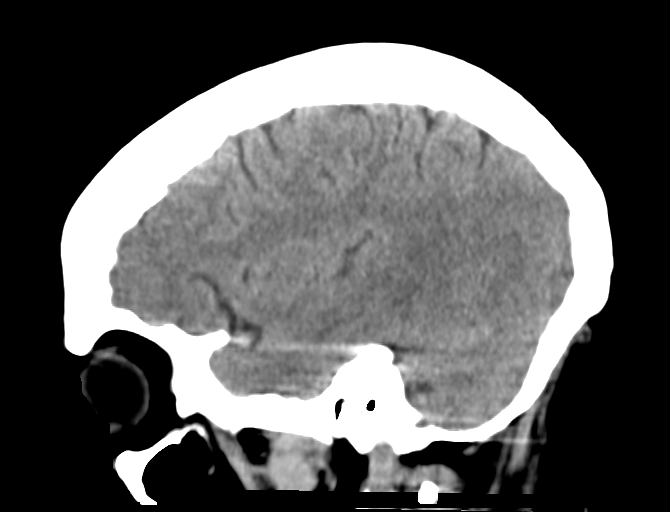
[im 17/33  brain]
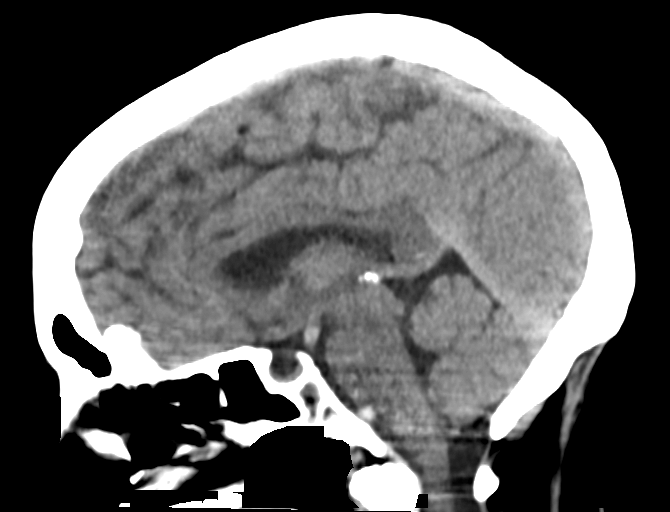
[im 22/33  brain]
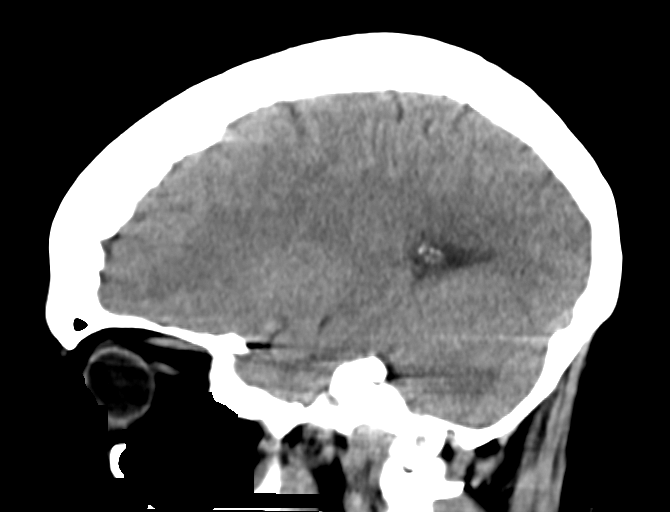

[Series 8: brain ax 2.00 hr60 s3 · axial · 0.33mm/px · z∈[-522,-396]mm · 8 of 79 slices shown, 10 images]
[im 8/79  brain]
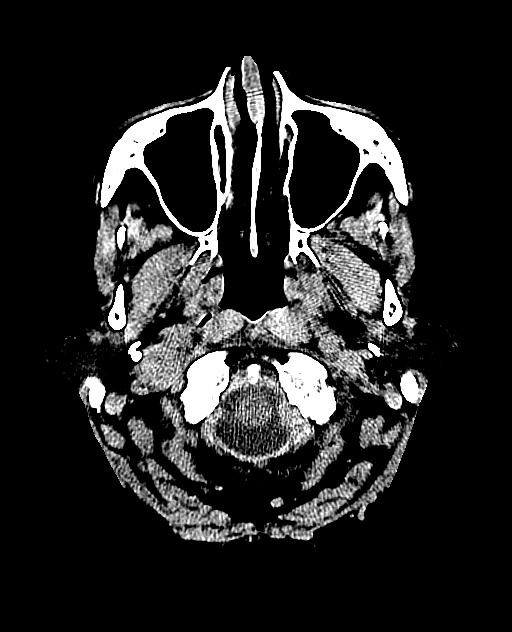
[im 8/79  bone]
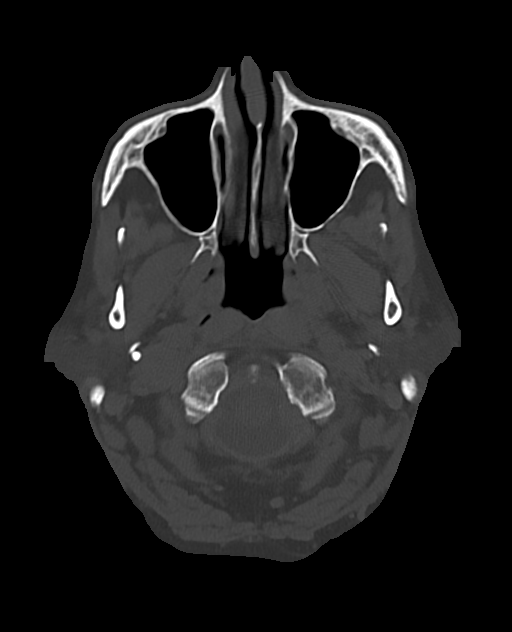
[im 15/79  brain]
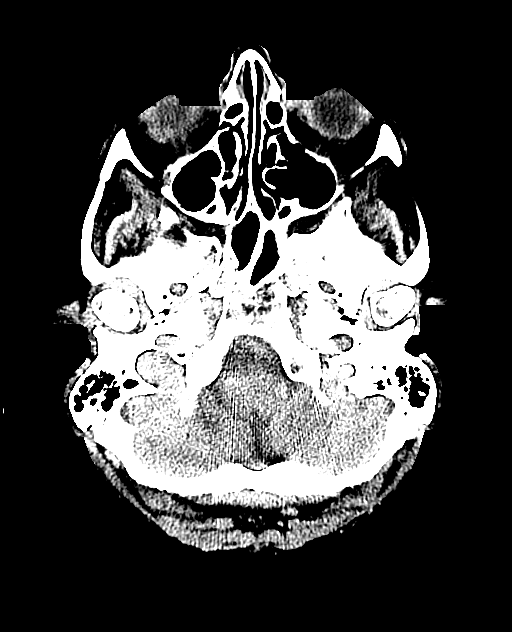
[im 27/79  brain]
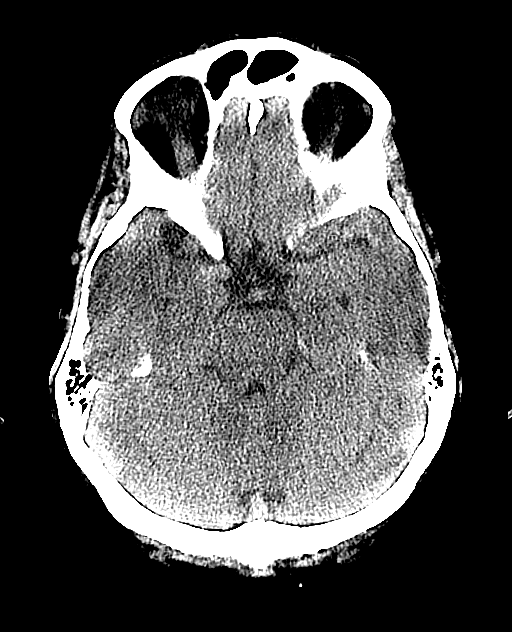
[im 34/79  brain]
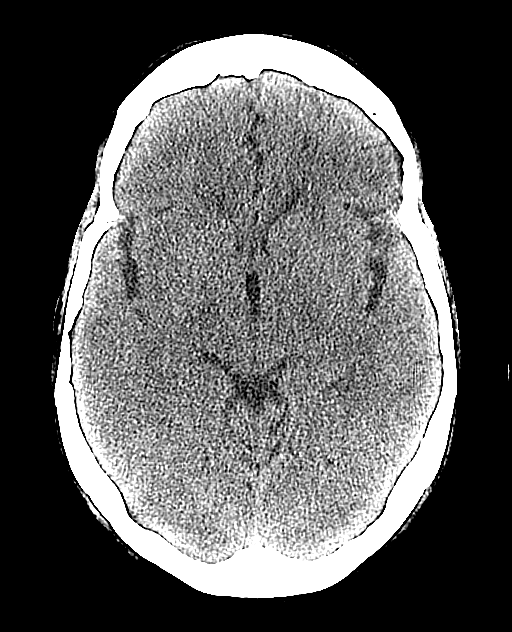
[im 45/79  brain]
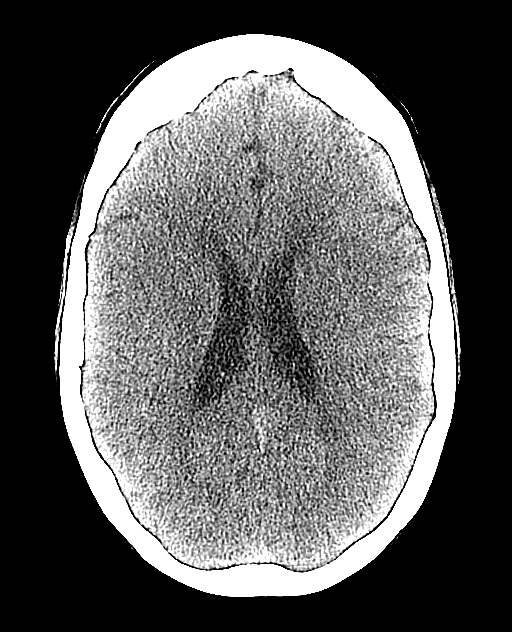
[im 45/79  bone]
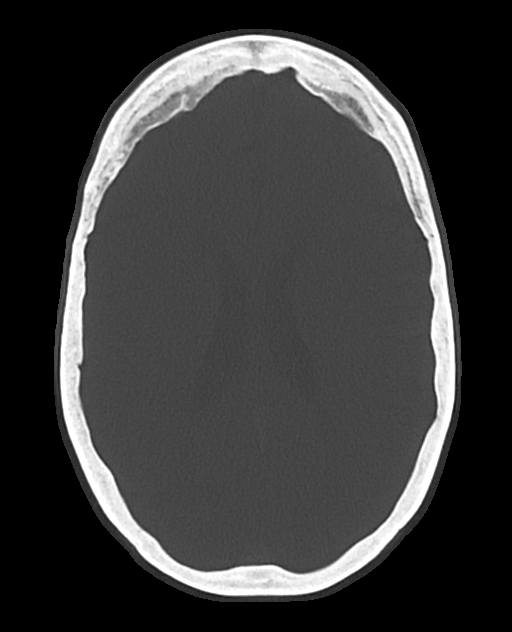
[im 53/79  brain]
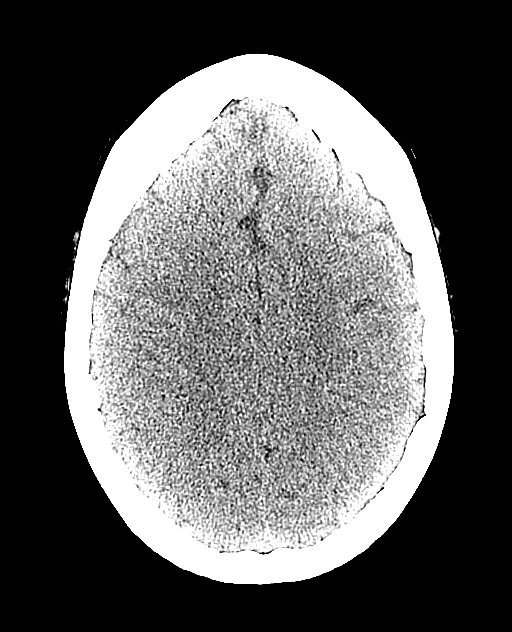
[im 64/79  brain]
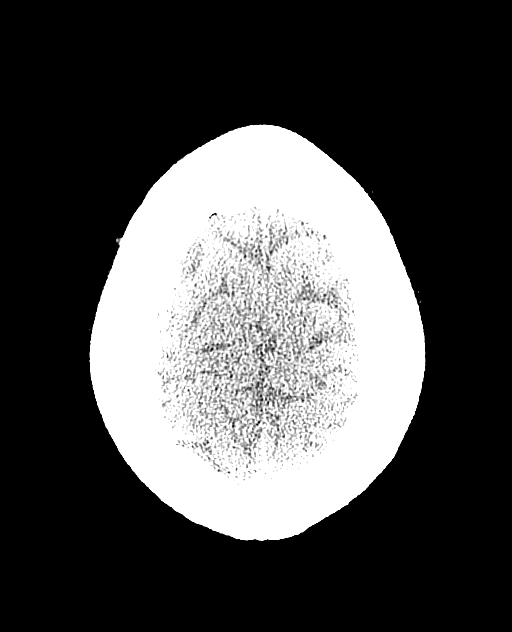
[im 71/79  brain]
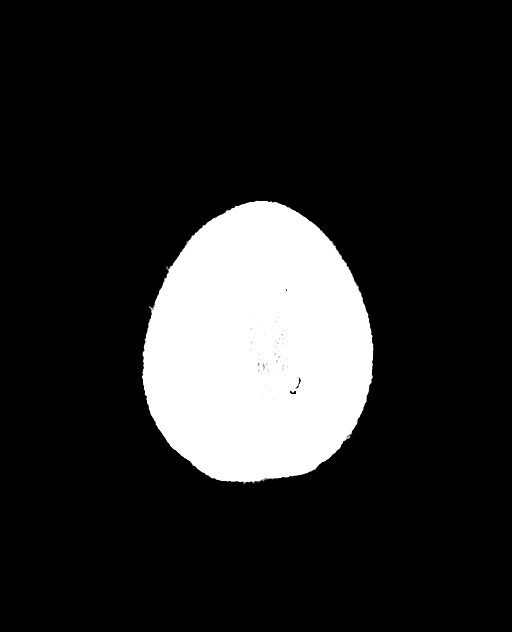

[14 of 47 positions shown; findings below may reference images not displayed]

EXAM

Head CT without contrast

INDICATION

left leg numbness and heaviness, dizziness
left leg numbness and heaviness, dizziness, started at 8am this morning. CT/NM 0/0. kf

FINDINGS

CT of the head was performed without contrast.

All CT scans at this facility use dose modulation, iterative reconstruction, and/or weight based
dosing when appropriate to reduce radiation dose to as low as reasonably achievable. In the past 12
months, there have been 0 CT scans and 0 nuclear medicine myocardial perfusion scans.

There is no acute intracranial hemorrhage. There is no mass lesion or midline shift.

There are small areas of hypodensity in the periventricular white matter of the frontal lobes.

There is no skull fracture. There is hyperostosis of the inner table of the frontal bone.

IMPRESSION

There is no acute hemorrhage or mass lesion. There is no hydrocephalus. There is mild chronic small
vessel white matter disease involving the periventricular regions of the cerebral hemispheres.

Tech Notes:

left leg numbness and heaviness, dizziness, started at 8am this morning. CT/NM 0/0. kf

## 2021-07-28 ENCOUNTER — Encounter: Admit: 2021-07-28 | Discharge: 2021-07-28 | Payer: MEDICARE

## 2021-07-30 ENCOUNTER — Encounter: Admit: 2021-07-30 | Discharge: 2021-07-30 | Payer: MEDICARE

## 2021-07-30 DIAGNOSIS — E785 Hyperlipidemia, unspecified: Secondary | ICD-10-CM

## 2021-07-30 DIAGNOSIS — R0989 Other specified symptoms and signs involving the circulatory and respiratory systems: Secondary | ICD-10-CM

## 2021-07-30 DIAGNOSIS — I10 Essential (primary) hypertension: Secondary | ICD-10-CM

## 2021-07-30 DIAGNOSIS — I4821 Permanent atrial fibrillation: Secondary | ICD-10-CM

## 2021-07-30 DIAGNOSIS — Z8673 Personal history of transient ischemic attack (TIA), and cerebral infarction without residual deficits: Secondary | ICD-10-CM

## 2021-07-30 MED ORDER — DILTIAZEM HCL 180 MG PO CP24
180 mg | ORAL_CAPSULE | Freq: Every day | ORAL | 3 refills | 90.00000 days | Status: AC
Start: 2021-07-30 — End: ?

## 2021-07-30 NOTE — Progress Notes
Date of Service: 07/30/2021    Karen Horton is a 81 y.o. female.       HPI    Karen Horton is?followed for permanent atrial fibrillation.?   Her blood pressure has been very well controlled over the past 6 months. ?Karen Horton reports no infectious or febrile symptoms during the Covid pandemic?and she has received her Covid vaccines..She indicates that she has been doing well. ?Two days a week she continues to perform water aerobics for an hour at the local [employer]. ?On the other 3 days a week she does aerobic exercises at the [facility] followed by stretching exercises each for 30 minutes. She also walks her dog on a regular basis. ?The patient reports no angina, congestive symptoms, palpitations, sensation of sustained forceful heart pounding, lightheadedness or syncope.??Her exercise tolerance has been stable. The patient reports no?claudication,?myalgias, bleeding abnormalities, neurologic motor abnormalities or difficulty with speech.? No emergency room visits or hospitalizations or reported in recent months.  Karen Horton does report some chronic fatigue and tiredness but it has been stable over the past year.  Her weight is up 6 pounds over the past 6 months.       Vitals:    07/30/21 1247   BP: 132/82   BP Source: Arm, Left Upper   Pulse: 49   SpO2: 97%   O2 Device: None (Room air)   PainSc: Zero   Weight: 92.6 kg (204 lb 3.2 oz)   Height: 165.1 cm (5' 5)     Body mass index is 33.98 kg/m?Marland Kitchen     Past Medical History  Patient Active Problem List    Diagnosis Date Noted   ? 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    08/2017 - GI consult planned--Dr. Salvadore Farber in [place]     ? Precordial pain 08/02/2017   ? Permanent atrial fibrillation (HCC) 04/17/2014     2014 - Stroke--no known AF at that time  2015 - Presented with asymptomatic AF.  Eliquis started.  Chronic, permanent AF.  2016 - Diltiazem started for rate control     ? 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  No known etiology at that time.     ? HLD (hyperlipidemia) 09/09/2010   ? Essential hypertension 09/07/2010     January, 2012 - lisinopril started           Review of Systems   Constitutional: Positive for malaise/fatigue.   HENT: Negative.    Eyes: Negative.    Cardiovascular: Negative.    Respiratory: Negative.    Endocrine: Negative.    Hematologic/Lymphatic: Negative.    Skin: Negative.    Musculoskeletal: Negative.    Gastrointestinal: Negative.    Genitourinary: Negative.    Neurological: Negative.    Psychiatric/Behavioral: Negative.    Allergic/Immunologic: Negative.        Physical Exam  GENERAL: The patient is well developed, well nourished, resting comfortably and in no distress.   HEENT: No abnormalities of the visible oro-nasopharynx, conjunctiva or sclera are noted.  NECK: There is no jugular venous distension. Carotids are palpable and without bruits. There is no thyroid enlargement.  Chest: Lung fields are clear to auscultation. There are no wheezes or crackles.  CV: There is an irregular rhythm?with variable intensity of the first heart sound.??There are no murmurs, gallops or rubs.??Her apical heart rate is?56?bpm.  ABD: The abdomen is soft and supple with normal bowel sounds.  There is no hepatosplenomegaly, ascites, tenderness, masses or bruits.  Neuro: There are no focal motor defects. Ambulation is normal. Cognitive function appears normal.  Ext:?There is no edema or evidence of deep vein thrombosis. Peripheral pulses are satisfactory. ?  SKIN:?There are no rashes and no cellulitis  PSYCH:?The patient is calm, rationale and oriented.    Cardiovascular Studies  A twelve-lead ECG was obtained on 07/30/2021 and reveals atrial fibrillation with a heart rate of 55 bpm.  Left axis deviation is noted.  Nondiagnostic ST-T wave abnormalities are seen.  Echo Doppler 07/13/2019:  Left ventricular systolic function is within normal limits.  LVEF 55%  Mild right ventricular enlargement with normal RV systolic function. ?TAPSE is 2.2 cm.  Moderate to severe left atrial enlargement.   Aortic valve sclerosis without stenosis.  Moderate mitral regurgitation.   No pericardial effusion.   Estimated peak systolic pulmonary artery pressure is 39 mmHg.?  ?  Heart catheterization/coronary angiography 07/04/2017:  Left ventricular systolic pressure was 130 mmHg with an end-diastolic pressure of 8 to 10 mmHg. ?There was no gradient across the aortic valve on pullback maneuver. ?  ?  SELECTIVE CORONARY ANGIOGRAPHY:??LEFT MAIN CORONARY ARTERY: ?The left main coronary artery is a large caliber vessel which distally trifurcates into LAD, circumflex, and ramus. ?There is no angiographically significant disease noted within the left main.   LAD: ?The LAD is a medium caliber vessel which gives off 2 major diagonal branches. ?The LAD system has only mild luminal irregularities present.   RAMUS: ?The ramus is a medium caliber vessel which is free of angiographically significant disease.   LEFT CIRCUMFLEX: ?The circumflex is a medium caliber vessel which gives off 2 small obtuse marginals. ?The circumflex system has only mild luminal irregularities present.   RIGHT CORONARY ARTERY: ?The right coronary artery is a large caliber dominant vessel which has mild luminal irregularities within the AV groove segment. ?There is no significant obstructive disease noted.  IMPRESSION:??  1. Normal coronary anatomy.  2. No obstructive disease.  3. Normal left ventricular end-diastolic pressure  Cardiovascular Health Factors  Vitals BP Readings from Last 3 Encounters:   07/30/21 132/82   11/11/20 126/72   02/07/20 124/76     Wt Readings from Last 3 Encounters:   07/30/21 92.6 kg (204 lb 3.2 oz)   11/11/20 89.8 kg (198 lb)   02/07/20 87.9 kg (193 lb 12.8 oz)     BMI Readings from Last 3 Encounters:   07/30/21 33.98 kg/m?   11/11/20 32.95 kg/m?   02/07/20 32.25 kg/m?      Smoking Social History     Tobacco Use   Smoking Status Former   ? Years: 5.00   ? Types: Cigarettes   ? Quit date: 07/05/1962   ? Years since quitting: 59.1   Smokeless Tobacco Never      Lipid Profile Cholesterol   Date Value Ref Range Status   10/14/2020 167  Final     HDL   Date Value Ref Range Status   10/14/2020 71  Final     LDL   Date Value Ref Range Status   10/14/2020 85  Final     Triglycerides   Date Value Ref Range Status   10/14/2020 54  Final      Blood Sugar Hemoglobin A1C   Date Value Ref Range Status   06/30/2017 5.5 4.0 - 6.0 % Final     Comment:     The ADA recommends that most patients with type  1 and type 2 diabetes maintain   an A1c level <7%.       Glucose   Date Value Ref Range Status   01/19/2021 101  Final   01/18/2021 117 (H) 70 - 105 Final   10/14/2020 91  Final          Problems Addressed Today  Encounter Diagnoses   Name Primary?   ? Cardiovascular symptoms Yes   ? Permanent atrial fibrillation (HCC)        Assessment and Plan     Ms. Tino indicates that she has been stable and reports no chest discomfort or congestive symptoms. ?She is tolerating anticoagulation without difficulty and her heart rate is well controlled. ?She is tolerating statin therapy without difficulty.?The risks and benefits of anticoagulation therapy have been reviewed with the patient. The patient understands that anticoagulation is used to decrease thrombotic or clotting complications associated with atrial fibrillation/flutter, such as stroke and systemic embolization which can be disabling or fatal, but can, on occasion, lead to life-threatening bleeding complications including?gastrointestinal or?intracranial hemorrhage. The patient wishes to?continue?anticoagulation with apixaban.??Her CHA2DS2-VASc score appears to be 4 for age (2 points), gender and hypertension.??  I congratulated her on her excellent exercise program.  Regular aerobic exercise, weight loss and adherence to a heart healthy diet were recommended. I have asked her to return for follow-up in 6 months time. The total time spent during this interview and exam with preparation and chart review was 30 minutes.         Current Medications (including today's revisions)  ? acetaminophen (TYLENOL) 500 mg tablet Take 500 mg by mouth every 6 hours as needed for Pain. Max of 4,000 mg of acetaminophen in 24 hours.   ? apixaban (ELIQUIS) 5 mg tab tablet Take 1 Tab by mouth twice daily.   ? cetirizine (ZYRTEC) 10 mg tablet Take 10 mg by mouth every morning.   ? cholecalciferol (VITAMIN D-3) 5000 unit tablet Take 1 tablet by mouth daily.   ? cyanocobalamin (VITAMIN B-12, RUBRAMIN) 1,000 mcg/mL injection Inject 1,000 mcg to area(s) as directed every 30 days.   ? diclofenac sodium (PENNSAID TP) Apply  topically to affected area as Needed.   ? diltiazem CD (CARDIZEM CD) 240 mg capsule Take 1 capsule by mouth once daily   ? FLUoxetine (PROZAC) 10 mg capsule Take 1 capsule by mouth daily.   ? FOLIC ACID/MULTIVIT-MIN/LUTEIN (CENTRUM SILVER PO) Take 1 tablet by mouth daily.   ? oxybutynin XL (DITROPAN XL) 5 mg tablet Take 1 tablet by mouth daily.   ? pantoprazole DR (PROTONIX) 20 mg tablet Take one tablet by mouth daily. (Patient taking differently: Take 40 mg by mouth daily.)   ? simvastatin (ZOCOR) 20 mg tablet Take 1 tablet by mouth daily.   ? traZODone (DESYREL) 50 mg tablet Take 1 tablet by mouth daily.   ? vitamin E 400 unit capsule Take 400 Units by mouth daily.

## 2021-07-31 IMAGING — MG MAMMOGRAM 3D SCREEN, BILATERAL
8 series · 8 of 8 positions shown · non-contrast
Comparison: none

[R CC]
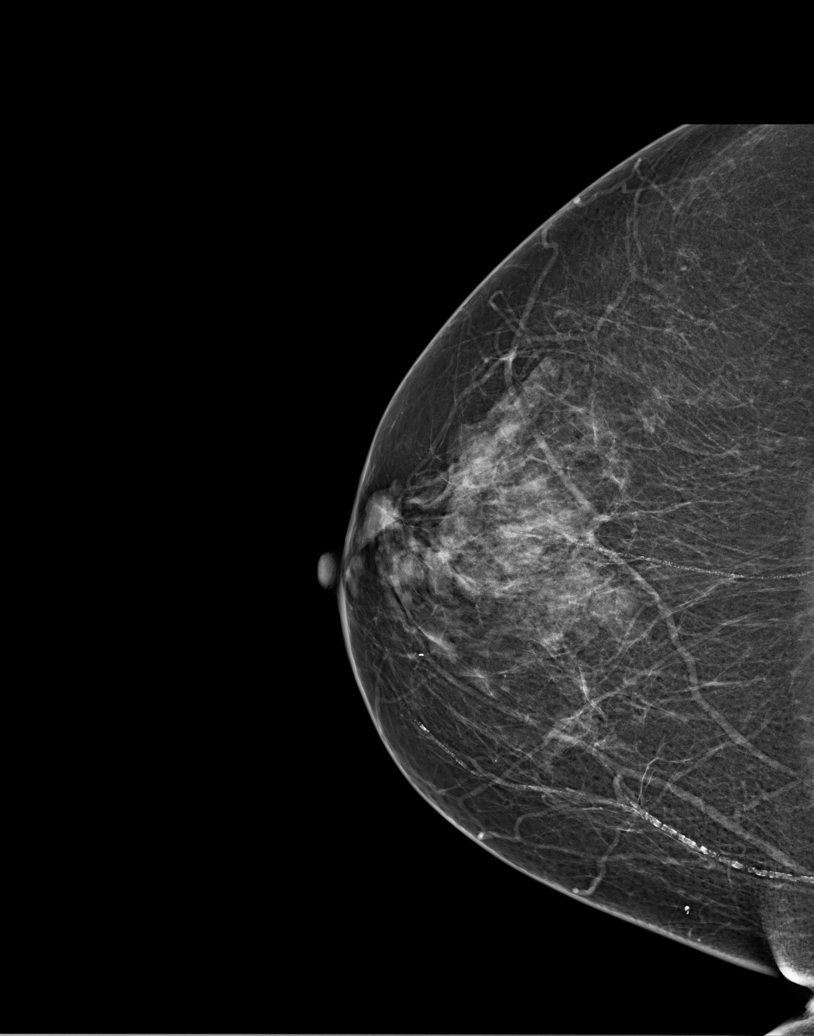

[R tomo (1 of 2)]
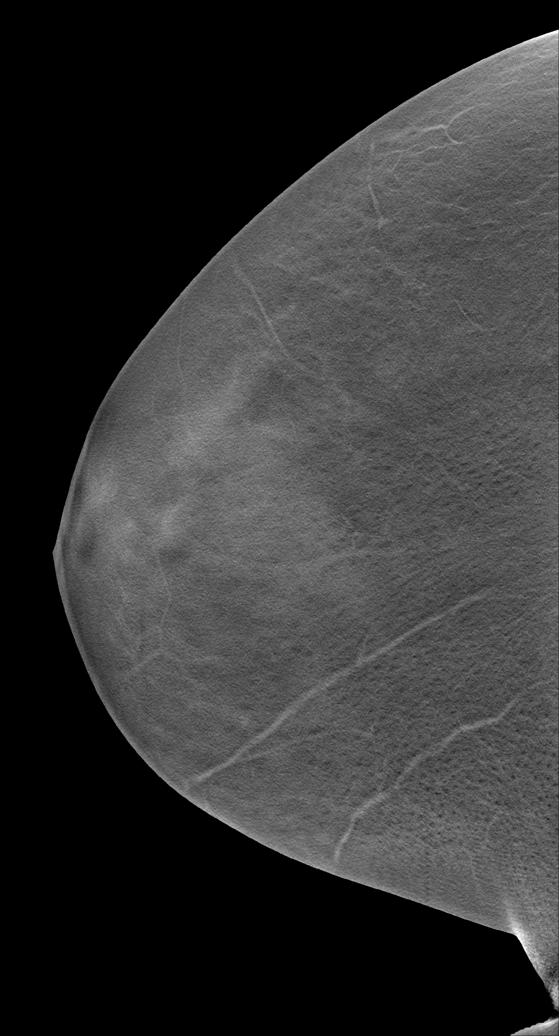

[L CC]
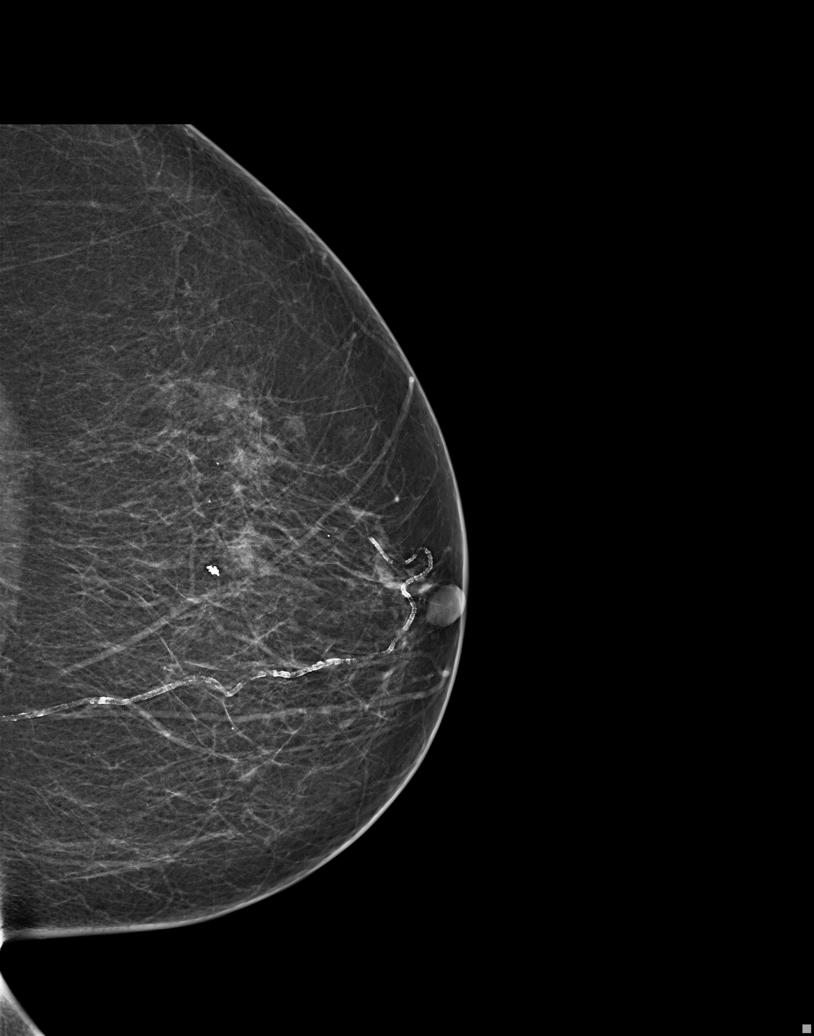

[L tomo (1 of 2)]
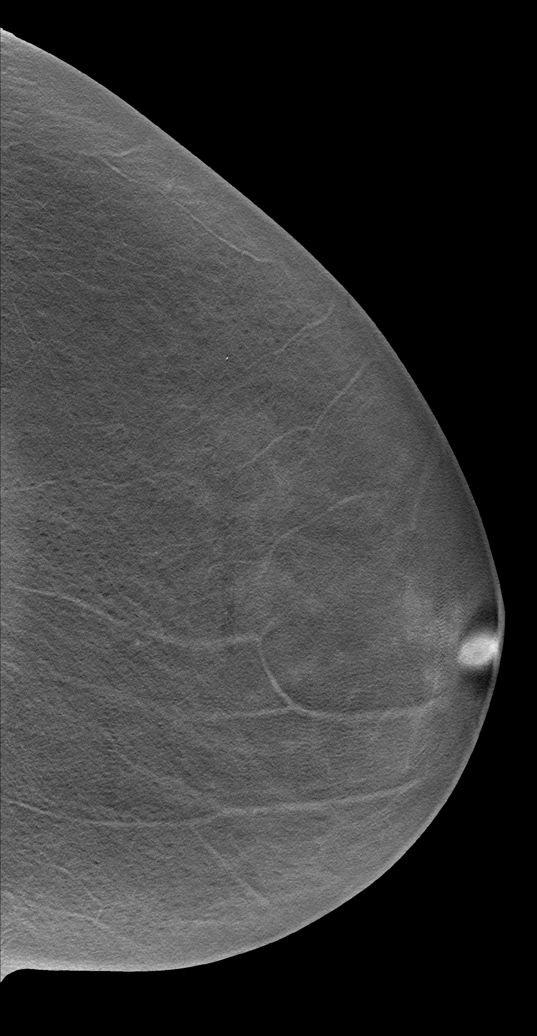

[R MLO]
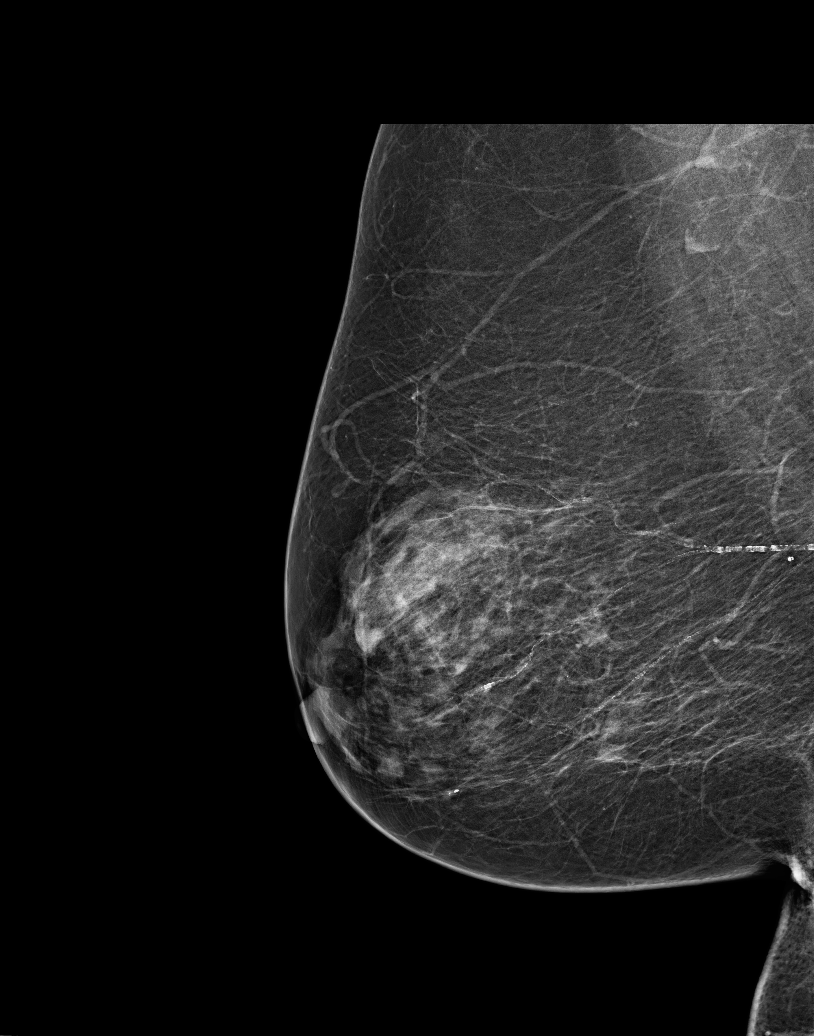

[R tomo (2 of 2)]
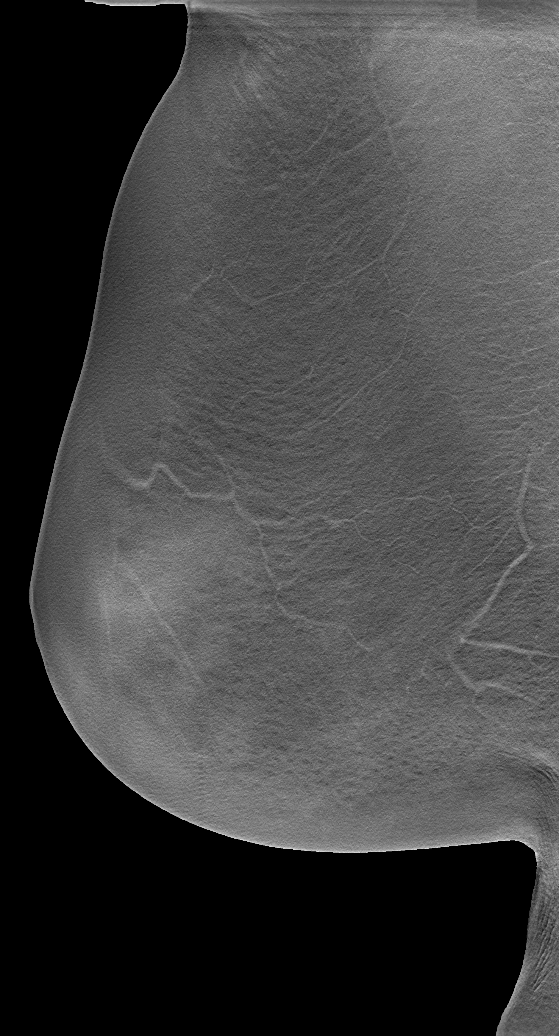

[L MLO]
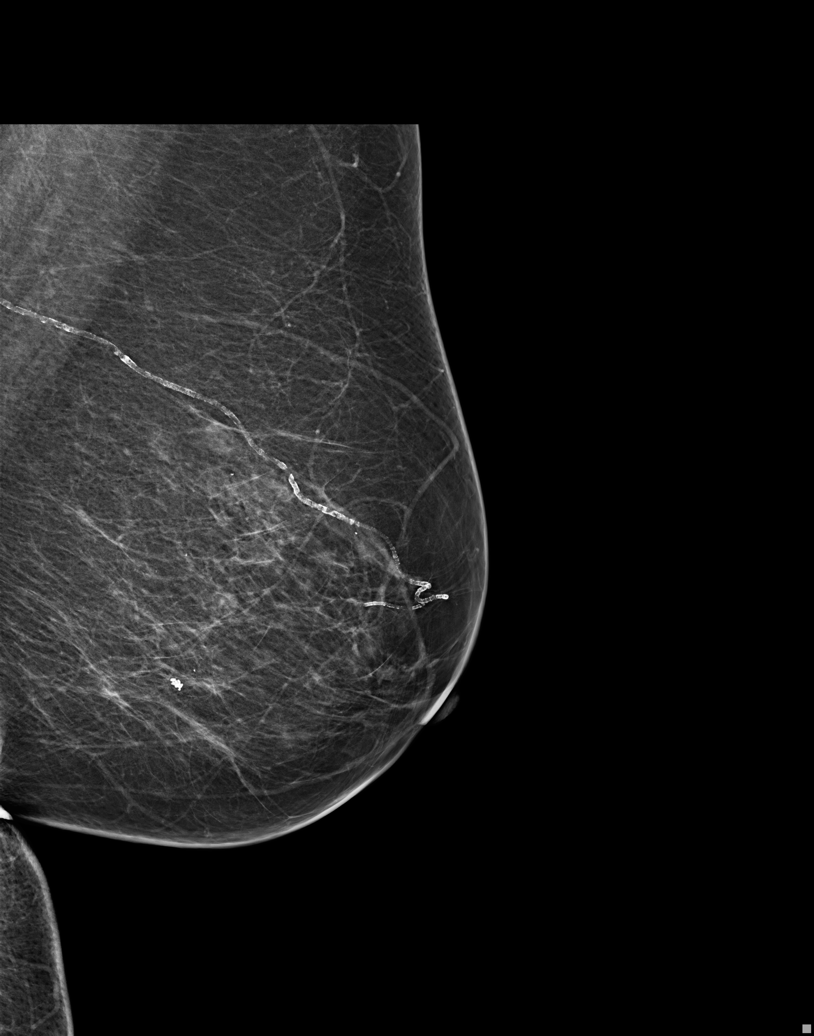

[L tomo (2 of 2)]
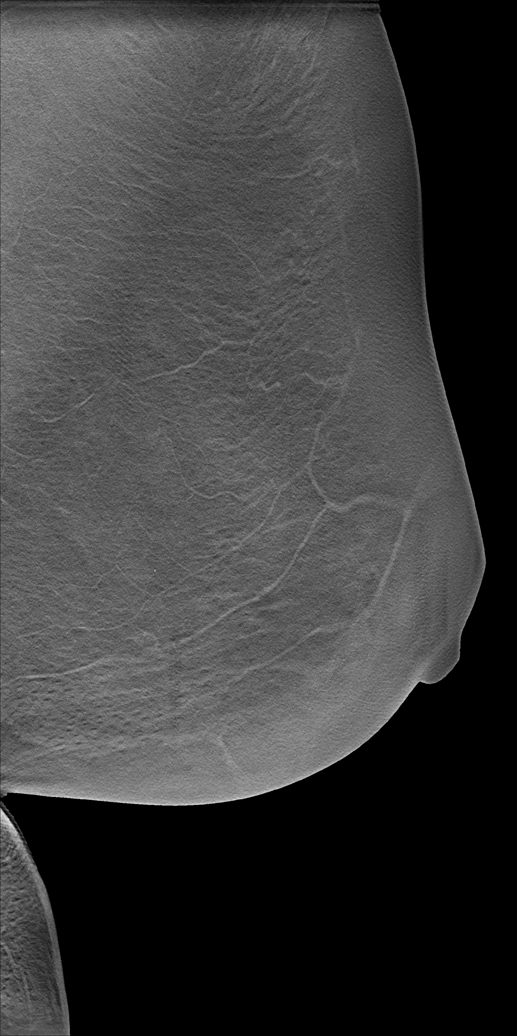

[8 of 8 positions shown; findings below may reference images not displayed]

EXAM

MM mammogram 3D screen bilat

INDICATION

screening
HX. 2 BENIGN BXs ON RT BREAST.  TC SCORE: 10 YR RISK=   1.5%,  LIFETIME RISK =   1.5%.  SCREENING.
AB (3D) PRIORS: 1019.

TECHNIQUE

2D and tomosynthesis digital craniocaudal and mediolateral oblique views were obtained of both
breasts. Computer aided detection software was utilized.

COMPARISONS

05/23/20

FINDINGS

Scattered fibroglandular tissue density.

No suspicious microcalcification, architectural distortion, or spiculated mass. Benign-appearing
bilateral calcifications.

IMPRESSION
1. BI-RADS 2, BENIGN.
2. A reminder letter will be sent.

Tech Notes:

## 2022-04-13 ENCOUNTER — Encounter: Admit: 2022-04-13 | Discharge: 2022-04-13 | Payer: MEDICARE

## 2022-04-14 ENCOUNTER — Encounter: Admit: 2022-04-14 | Discharge: 2022-04-14 | Payer: MEDICARE

## 2022-04-15 ENCOUNTER — Encounter: Admit: 2022-04-15 | Discharge: 2022-04-15 | Payer: MEDICARE

## 2022-04-15 ENCOUNTER — Ambulatory Visit: Admit: 2022-04-15 | Discharge: 2022-04-16 | Payer: MEDICARE

## 2022-04-15 DIAGNOSIS — I10 Essential (primary) hypertension: Secondary | ICD-10-CM

## 2022-04-15 DIAGNOSIS — I4821 Permanent atrial fibrillation: Secondary | ICD-10-CM

## 2022-04-15 DIAGNOSIS — R072 Precordial pain: Secondary | ICD-10-CM

## 2022-04-15 DIAGNOSIS — E782 Mixed hyperlipidemia: Secondary | ICD-10-CM

## 2022-04-15 DIAGNOSIS — E785 Hyperlipidemia, unspecified: Secondary | ICD-10-CM

## 2022-04-15 DIAGNOSIS — R0683 Snoring: Secondary | ICD-10-CM

## 2022-04-15 DIAGNOSIS — G4733 Obstructive sleep apnea (adult) (pediatric): Secondary | ICD-10-CM

## 2022-04-15 DIAGNOSIS — Z8673 Personal history of transient ischemic attack (TIA), and cerebral infarction without residual deficits: Secondary | ICD-10-CM

## 2022-04-15 MED ORDER — NITROGLYCERIN 0.4 MG SL SUBL
ORAL_TABLET | SUBLINGUAL | 3 refills | 9.00000 days | Status: AC
Start: 2022-04-15 — End: ?

## 2022-04-15 NOTE — Progress Notes
Cardiovascular Medicine       Date of Service: 04/15/2022      HPI     Karen Horton is a 81 y.o. female who was seen today in the Cardiovascular Medicine Clinic at [facility] at our [place] office.        Ms. Felland is?followed for permanent atrial fibrillation.   Previously saw Dr. Arna Medici for management of permanent atrial fibrillation.  He continues to be fairly active but has had some chest pain and worsening fatigue recently.  She reports a few episodes of substernal chest pressure that occurs at rest and has sometimes woken her up from her sleep.  She has noticed about 8-9 episodes over the last 6 months.  Her most recent episode was 2 weeks ago.  She took some of her husband sublingual nitroglycerin and this improved her symptoms.  Reports that she has not had any of the symptoms since 2018 when a coronary angiogram showed no significant obstructive coronary artery disease.  Two days a week?she continues to perform water aerobics for an hour at the local [employer]. ?On the other 3 days a week she does aerobic exercises at the [facility] followed by stretching exercises each for 30 minutes.?She also walks her dog on a regular basis. . The patient reports no?claudication,?myalgias, bleeding abnormalities, neurologic motor abnormalities or difficulty with speech.??No emergency room visits or hospitalizations or reported in recent months.  Her lipid panel in July 2023 has been well controlled.      Transthoracic echocardiogram 07/2019:  Left ventricular systolic function is within normal limits.  LVEF 55%  Mild right ventricular enlargement with normal RV systolic function.  TAPSE is 2.2 cm.  Moderate to severe left atrial enlargement.   Aortic valve sclerosis without stenosis.  Moderate mitral regurgitation.   No pericardial effusion.   Estimated peak systolic pulmonary artery pressure is 39 mmHg.    Coronary angiogram 2018.  IMPRESSION:    1. Normal coronary anatomy.  2. No obstructive disease.  3. Normal left ventricular end-diastolic pressure.                 Assessment & Plan   81 y.o. female patient with the following medical problems:    Permanent atrial fibrillation.  On long-term anticoagulation.  Chest pain.  Fatigue.    Given her risk factors and worsening daytime fatigue and decreased functional tolerance, will order a sleep study to evaluate for undiagnosed sleep apnea.  She does have chest pain that is improving with sublingual nitroglycerin.  Although her coronary angiogram was reassuring in 2018, given her symptoms, we will get a regadenoson on MPI to evaluate for any significant ischemia.  Permanent atrial fibrillation with well-controlled rates on diltiazem.  We will continue diltiazem and apixaban.          Return to clinic in 6 months.         Past Medical History  Patient Active Problem List    Diagnosis Date Noted   ? 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    08/2017 - GI consult planned--Dr. Salvadore Farber in [place]     ? Precordial pain 08/02/2017     07/04/2017: CARDIAC CATHETERIZATION REPORT:Normal coronary anatomy.No obstructive disease.Normal left ventricular end-diastolic pressure.  03/16/17 - Procedure: ADAC MULTI GATED SESTAMIBI REGADENOSON MPI STRESS TEST: Left Ventricular Ejection Fraction (post stress, in the resting state) =? 70 %. Left Ventricular End Diastolic Volume: 80  mL.This study is probably normal.  There is evidence of soft tissue attenuation, a significant ischemic change was not appreciated.  Left ventricular systolic function is normal, the ejection fraction was 70%. There are no high risk prognostic indicators present.  The pulmonary to myocardial count ratio is normal at 0.40, there is no transient ischemic dilatation noted.  The ECG portion of the study is negative for ischemia.The prior study was obtained on 04/24/2014.  It demonstrated an ejection fraction of 63%, the end-diastolic volume was 74 mL, the pulmonary myocardial count ratio 0.37, there is no transient ischemic dilatation noted.  The study also demonstrated soft tissue attenuation, most prominently involving the apex.  A definite ischemic change is not appreciated.In comparing the two studies qualitatively, the perfusion pattern appears quite similar, suggestive of no significant interval change.In aggregate the current study is low risk in regards to predicted annual cardiovascular mortality rate.       ?     ? Permanent atrial fibrillation (HCC) 04/17/2014     2014 - Stroke--no known AF at that time  2015 - Presented with asymptomatic AF.  Eliquis started.  Chronic, permanent AF.  2016 - Diltiazem started for rate control  07/13/19 -  2D + DOPPLER ECHO: Left ventricular systolic function is within normal limits.LVEF 55%.Mild right ventricular enlargement with normal RV systolic function.  TAPSE is 2.2 cm.Moderate to severe left atrial enlargement. Aortic valve sclerosis without stenosis.Moderate mitral regurgitation. No pericardial effusion. Estimated peak systolic pulmonary artery pressure is 39 mmHg  10/29/13 -  EVENT MONITOR:An event recorder was utilized from March 23 through April 23, a total of 31 days. There are 47 transmissions, 1 baseline, and the rest are automatic.  There is at least 1 transmission per day.  There are no symptom/patient initiated recordings. All of the recordings demonstrate AFib. The heart rate probably averages a little over 100 bpm.  I see 1 pause of 1.4 seconds.  Generally, the minimum rate is around 60 in the strips and the fastest rate is around 150 all the way up to 165 in the strips. It does look like this is chronic AFib (although I certainly cannot exclude that she is in and out on a daily basis). There are no ventricular arrhythmias. It does look like she would benefit from a little bit more rate control.  ?  ?       ? 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  No known etiology at that time.     ? HLD (hyperlipidemia) 09/09/2010   ? Essential hypertension 09/07/2010     January, 2012 - lisinopril started         I reviewed and confirmed this patient's problem list, active medications, allergies, and past medical, social, family & tobacco histories.     Review of Systems  10 point review of systems negative except as above.  ROS    Vitals:    04/15/22 0851   BP: 128/74   BP Source: Arm, Left Upper   Pulse: 66   SpO2: 98%   O2 Device: None (Room air)   PainSc: Zero   Weight: 88.9 kg (196 lb)   Height: 165.1 cm (5' 5)     Body mass index is 32.62 kg/m?Marland Kitchen     Physical Exam  General Appearance: no acute distress  HEENT: EOMI, mucous membranes moist, oropharynx is clear  Neck Veins: neck veins are flat & not distended  Carotid Arteries:  no bruits  Chest Inspection: chest is normal in appearance  Auscultation/Percussion: lungs clear to auscultation, no rales, rhonchi, or wheezing  Cardiac Rhythm: Irregularly irregular.    Cardiac Auscultation: Normal S1 & S2, no S3 or S4, no rub  Murmurs: no cardiac murmurs  Abdominal Exam: soft, non-tender, normal bowel sounds, no masses or bruits  Abdominal aorta: nonpalpable   Liver & Spleen: no organomegaly  Extremities: no lower extremity edema; palpable distal pulses  Skin: warm & intact  Neurologic Exam: oriented to time, place and person; no focal neurologic deficits       Cardiovascular Studies  07/13/19   2D + DOPPLER ECHO   Result Value Ref Range    BSA 2.02 m2    LVIDD 5.2 3.8 - 5.2 cm    IVS 1.1 0.6 - 0.9 cm    PW 1.1 0.6 - 0.9 cm    LVIDS 3.3 2.2 - 3.5 cm    FS 36.54 28 - 44 %    Teichholtz 62.12 %    LA volume 71 22 - 52 mL    Sinus 3.5 2.4 - 3.6 cm    Ascending aorta 3.8 cm    LV mass 221 67 - 162 g    LA size 4.9 2.7 - 3.8 cm    RWT 0.42 <=0.42    AV peak velocity 2.0 m/s    E/A ratio 2.63     TDI lateral e' 0.090 m/s    Lateral E/E' ratio 9.33     Right Ventricular Basal Diameter 4.3 2.5 - 4.1 cm    Right Ventricular Mid Diameter 3.5 1.9 - 3.5 cm    Right Ventricular Wall 14.6 0.1 - 0.5 cm    Right Atrial Area 21.7 <18 cm2    Right Heart Systolic Mmode TAPSE 2.2 >1.7 cm    MV Peak E Vel PW 0.840 m/s    MV Peak A Vel 0.320 m/s    Left Atrium Index 35.15 16 - 34 mL/m2    Cardiology Ultrasound Machine [employer] Epiq     Left Ventricle Mass Index 109 43 - 95 g/m2    Left Ventricle Diastolic Volume 77 46 - 106 mL    Left Ventricle Diastolic Volume Index 38 29 - 61 mL/m2    Left Ventricle Systolic Volume 23 14 - 42 mL    Left Ventricle Systolic Volume Index 11 8 - 24 mL/m2    TDI Medial e' 0.090 m/s    Medial E/E' ratio 9.33     TV rest pulmonary artery pressure 39 mmHg    ECHO EF 55 %    TR PEAK VELOCITY 3 m/s    RV SYSTOLIC PRESSURE 36     RA PRESSURE 3         Cardiovascular Health Factors  Vitals BP Readings from Last 3 Encounters:   04/15/22 128/74   07/30/21 132/82   11/11/20 126/72     Wt Readings from Last 3 Encounters:   04/15/22 88.9 kg (196 lb)   07/30/21 92.6 kg (204 lb 3.2 oz)   11/11/20 89.8 kg (198 lb)     BMI Readings from Last 3 Encounters:   04/15/22 32.62 kg/m?   07/30/21 33.98 kg/m?   11/11/20 32.95 kg/m?      Smoking Social History     Tobacco Use   Smoking Status Former   ? Years: 5   ? Types: Cigarettes   ? Quit date: 07/05/1962   ? Years since quitting: 9.8  Smokeless Tobacco Never      Lipid Profile Cholesterol   Date Value Ref Range Status   01/20/2022 165  Final     HDL   Date Value Ref Range Status   01/20/2022 60  Final     LDL   Date Value Ref Range Status   01/20/2022 94  Final     Triglycerides   Date Value Ref Range Status   01/20/2022 56  Final      Blood Sugar Hemoglobin A1C   Date Value Ref Range Status   06/30/2017 5.5 4.0 - 6.0 % Final     Comment:     The ADA recommends that most patients with type 1 and type 2 diabetes maintain   an A1c level <7%.       Glucose   Date Value Ref Range Status   01/20/2022 92  Final   01/19/2021 101  Final   01/18/2021 117 (H) 70 - 105 Final        ASCVD Risk Assessment:     ASCVD 10-year risk calculated: The ASCVD Risk score (Arnett DK, et al., 2019) failed to calculate for the following reasons:    The patient has a prior MI or stroke diagnosis     LDL 70-189, if ASCVD 10-y risk is >7.5%, high to moderate-intensity statin therapy is recommended  Diabetes with ASCVD 10-y risk >7.5%, high-intensity statin therapy is recommended.  Diabetes with ASCVD 10-y risk <7.5%, moderate-intensity statin therapy is recommended.      Current Medications (including today's revisions)  ? acetaminophen (TYLENOL) 500 mg tablet Take one tablet by mouth every 6 hours as needed for Pain. Max of 4,000 mg of acetaminophen in 24 hours.   ? apixaban (ELIQUIS) 5 mg tab tablet Take 1 Tab by mouth twice daily.   ? cetirizine (ZYRTEC) 10 mg tablet Take one tablet by mouth every morning.   ? cholecalciferol (VITAMIN D-3) 5000 unit tablet Take one tablet by mouth daily.   ? cyanocobalamin (VITAMIN B-12, RUBRAMIN) 1,000 mcg/mL injection Inject 1 mL into the muscle every 30 days.   ? diclofenac sodium (PENNSAID TP) Apply  topically to affected area as Needed.   ? diltiazem CD (CARDIZEM CD) 180 mg capsule Take one capsule by mouth daily.   ? FOLIC ACID/MULTIVIT-MIN/LUTEIN (CENTRUM SILVER PO) Take 1 tablet by mouth daily.   ? oxybutynin XL (DITROPAN XL) 5 mg tablet Take one tablet by mouth daily.   ? pantoprazole DR (PROTONIX) 20 mg tablet Take one tablet by mouth daily. (Patient taking differently: Take two tablets by mouth daily.)   ? simvastatin (ZOCOR) 20 mg tablet Take one tablet by mouth daily.   ? vitamin E 400 unit capsule Take one capsule by mouth daily.         Orpah Cobb MD  Cardiovascular Medicine.

## 2022-04-26 ENCOUNTER — Encounter: Admit: 2022-04-26 | Discharge: 2022-04-26 | Payer: MEDICARE

## 2022-05-04 ENCOUNTER — Ambulatory Visit: Admit: 2022-05-04 | Discharge: 2022-05-04 | Payer: MEDICARE

## 2022-05-04 ENCOUNTER — Encounter: Admit: 2022-05-04 | Discharge: 2022-05-04 | Payer: MEDICARE

## 2022-05-04 DIAGNOSIS — I4821 Permanent atrial fibrillation: Secondary | ICD-10-CM

## 2022-06-08 ENCOUNTER — Encounter: Admit: 2022-06-08 | Discharge: 2022-06-08 | Payer: MEDICARE

## 2022-06-08 DIAGNOSIS — R0681 Apnea, not elsewhere classified: Secondary | ICD-10-CM

## 2022-06-08 NOTE — Progress Notes
Sleep study is abnormal.     Sleep study report should be uploaded in EPIC soon, please see the final report for details.

## 2022-06-09 ENCOUNTER — Encounter: Admit: 2022-06-09 | Discharge: 2022-06-09 | Payer: MEDICARE

## 2022-06-09 DIAGNOSIS — G4733 Obstructive sleep apnea (adult) (pediatric): Secondary | ICD-10-CM

## 2022-06-11 ENCOUNTER — Encounter: Admit: 2022-06-11 | Discharge: 2022-06-11 | Payer: MEDICARE

## 2022-06-11 DIAGNOSIS — G4730 Sleep apnea, unspecified: Secondary | ICD-10-CM

## 2022-06-14 ENCOUNTER — Encounter: Admit: 2022-06-14 | Discharge: 2022-06-14 | Payer: MEDICARE

## 2022-06-23 ENCOUNTER — Encounter: Admit: 2022-06-23 | Discharge: 2022-06-23 | Payer: MEDICARE

## 2022-06-23 DIAGNOSIS — G4733 Obstructive sleep apnea (adult) (pediatric): Secondary | ICD-10-CM

## 2022-06-23 NOTE — Progress Notes
===  View-only below this line===  ----- Message -----  From: Orpah Cobb, MD  Sent: 06/23/2022   3:24 PM CST  To: Verdon Cummins; [facility] Nurse [facility] Team [facility]  Subject: RE: IN LAB SLEEP STUDY                           Apologies.   Can we have her seen in sleep clinic please?    Thanks  [name]    ----- Message -----  From: Verdon Cummins  Sent: 06/23/2022   3:01 PM CST  To: Orpah Cobb, MD  Subject: RE: IN LAB SLEEP STUDY                           She completed a HST on 05/21/22. Do you want her to do another one?    ----- Message -----  From: Orpah Cobb, MD  Sent: 06/23/2022   2:24 PM CST  To: Verdon Cummins; [facility] Nurse [facility] Card Team [facility]  Subject: RE: IN LAB SLEEP STUDY                           Thanks for letting me know?  Team, can we accommodate Ms. Gerlich with an in home study perhps?      Thanks  [name]  ----- Message -----  From: Verdon Cummins  Sent: 06/23/2022   2:06 PM CST  To: Orpah Cobb, MD  Subject: IN LAB SLEEP STUDY                               Good Afternoon,      FYI:The In Lab Sleep Study requested for this patient has been canceled. 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    Thank You    Verdon Cummins, PSR,  [facility]  [address]  [place], [place] [address]  [phone]

## 2022-06-29 ENCOUNTER — Encounter: Admit: 2022-06-29 | Discharge: 2022-06-29 | Payer: MEDICARE

## 2022-07-22 ENCOUNTER — Encounter: Admit: 2022-07-22 | Discharge: 2022-07-22 | Payer: MEDICARE

## 2022-07-22 MED ORDER — DILTIAZEM HCL 180 MG PO CP24
180 mg | ORAL_CAPSULE | Freq: Every day | ORAL | 3 refills | 90.00000 days | Status: AC
Start: 2022-07-22 — End: ?

## 2022-08-06 ENCOUNTER — Encounter: Admit: 2022-08-06 | Discharge: 2022-08-06 | Payer: MEDICARE

## 2022-08-26 ENCOUNTER — Encounter: Admit: 2022-08-26 | Discharge: 2022-08-26 | Payer: MEDICARE

## 2022-08-26 ENCOUNTER — Ambulatory Visit: Admit: 2022-08-26 | Discharge: 2022-08-27 | Payer: MEDICARE

## 2022-08-26 DIAGNOSIS — I4821 Permanent atrial fibrillation: Secondary | ICD-10-CM

## 2022-08-26 DIAGNOSIS — R0989 Other specified symptoms and signs involving the circulatory and respiratory systems: Secondary | ICD-10-CM

## 2022-08-26 DIAGNOSIS — I10 Essential (primary) hypertension: Secondary | ICD-10-CM

## 2022-08-26 DIAGNOSIS — E785 Hyperlipidemia, unspecified: Secondary | ICD-10-CM

## 2022-08-26 DIAGNOSIS — E782 Mixed hyperlipidemia: Secondary | ICD-10-CM

## 2022-08-26 DIAGNOSIS — G4733 Obstructive sleep apnea (adult) (pediatric): Secondary | ICD-10-CM

## 2022-08-26 DIAGNOSIS — R072 Precordial pain: Secondary | ICD-10-CM

## 2022-08-26 DIAGNOSIS — Z8673 Personal history of transient ischemic attack (TIA), and cerebral infarction without residual deficits: Secondary | ICD-10-CM

## 2022-08-26 MED ORDER — FLECAINIDE 50 MG PO TAB
50 mg | ORAL_TABLET | Freq: Two times a day (BID) | ORAL | 1 refills | 30.00000 days | Status: AC
Start: 2022-08-26 — End: ?

## 2022-08-26 NOTE — Progress Notes
Cardiovascular Medicine       Date of Service: 08/26/2022      HPI     Karen Horton is a 82 y.o. female who was seen today in the Cardiovascular Medicine Clinic at [facility] at our [place] office.     She has a past medical history of atrial fibrillation.     She previously saw Dr. Arna Medici for management of permanent atrial fibrillation.    At her last clinic visit, she reported some chest pressure and a stress test was ordered.  She underwent a treadmill MPI that showed no significant ischemia.  Two days a week she continues to perform water aerobics for an hour at the local [employer].  On the other 3 days a week she does aerobic exercises at the [facility] followed by stretching exercises each for 30 minutes. She also walks her dog on a regular basis. . The patient reports no claudication, myalgias, bleeding abnormalities, neurologic motor abnormalities or difficulty with speech.  No emergency room visits or hospitalizations or reported in recent months.      She continues to be in atrial fibrillation.  I discussed rhythm control with her and she states that she is open to cardioversion and antiarrhythmic initiation.  She has not discussed rhythm control with a cardiologist in the past.  Given that she has a normal stress test, I recommended that she be cardioverted and start flecainide.  She is open to this idea.     Transthoracic echocardiogram 07/2019:  Left ventricular systolic function is within normal limits.  LVEF 55%  Mild right ventricular enlargement with normal RV systolic function.  TAPSE is 2.2 cm.  Moderate to severe left atrial enlargement.   Aortic valve sclerosis without stenosis.  Moderate mitral regurgitation.   No pericardial effusion.   Estimated peak systolic pulmonary artery pressure is 39 mmHg.     Coronary angiogram 2018.  IMPRESSION:    Normal coronary anatomy.  No obstructive disease.  Normal left ventricular end-diastolic pressure.     Bruce treadmill MPI 10/23.   This study is probably normal but intermediate risk based on the Duke treadmill score.  In light of the absence of corresponding regional wall motion or thickening abnormalities, the mild fixed reduction in tracer activity in the distal anterior wall is probably attributable to soft tissue attenuation artifact.  There does not appear to be convincing evidence of inducible ischemia or definite evidence of infarction. Left ventricular systolic function is normal with normal thickening of all myocardial segments. There are no high risk prognostic indicators present.  The exercise ECG is nondiagnostic for ischemia.  The patient demonstrated poor exercise capacity with a normal heart rate and blood pressure response to exercise.                Assessment & Plan   82 y.o. female patient with the following medical problems:    Permanent atrial fibrillation.  On long-term anticoagulation.  Chest pain.  Fatigue.     She has an appointment in sleep clinic scheduled for March 2024.  Stress test did not show any significant ischemia.  Atrial fibrillation with well-controlled rates on diltiazem.  We will continue diltiazem and apixaban.  Rhythm control has not been attempted in the past.  Patient is open to cardioversion followed by flecainide 50 mg twice daily.  She is compliant with her apixaban.  Will set up nurse only visit after initiating flecainide to monitor ECG.  Return to clinic in 3 months.         Past Medical History  Patient Active Problem List    Diagnosis Date Noted    Gastroesophageal reflux disease with esophagitis 08/02/2017     06/2017 - Protonix started for reflux symptoms.    08/2017 - GI consult planned--Dr. Salvadore Farber in [place]      Precordial pain 08/02/2017     07/04/2017: CARDIAC CATHETERIZATION REPORT:Normal coronary anatomy.No obstructive disease.Normal left ventricular end-diastolic pressure.  03/16/17 - Procedure: ADAC MULTI GATED SESTAMIBI REGADENOSON MPI STRESS TEST: Left Ventricular Ejection Fraction (post stress, in the resting state) =  70 %. Left Ventricular End Diastolic Volume: 80 mL.This study is probably normal.  There is evidence of soft tissue attenuation, a significant ischemic change was not appreciated.  Left ventricular systolic function is normal, the ejection fraction was 70%. There are no high risk prognostic indicators present.  The pulmonary to myocardial count ratio is normal at 0.40, there is no transient ischemic dilatation noted.  The ECG portion of the study is negative for ischemia.The prior study was obtained on 04/24/2014.  It demonstrated an ejection fraction of 63%, the end-diastolic volume was 74 mL, the pulmonary myocardial count ratio 0.37, there is no transient ischemic dilatation noted.  The study also demonstrated soft tissue attenuation, most prominently involving the apex.  A definite ischemic change is not appreciated.In comparing the two studies qualitatively, the perfusion pattern appears quite similar, suggestive of no significant interval change.In aggregate the current study is low risk in regards to predicted annual cardiovascular mortality rate.              Permanent atrial fibrillation (HCC) 04/17/2014     2014 - Stroke--no known AF at that time  2015 - Presented with asymptomatic AF.  Eliquis started.  Chronic, permanent AF.  2016 - Diltiazem started for rate control  07/13/19 -  2D + DOPPLER ECHO: Left ventricular systolic function is within normal limits.LVEF 55%.Mild right ventricular enlargement with normal RV systolic function.  TAPSE is 2.2 cm.Moderate to severe left atrial enlargement. Aortic valve sclerosis without stenosis.Moderate mitral regurgitation. No pericardial effusion. Estimated peak systolic pulmonary artery pressure is 39 mmHg  10/29/13 -  EVENT MONITOR:An event recorder was utilized from March 23 through April 23, a total of 31 days. There are 47 transmissions, 1 baseline, and the rest are automatic.  There is at least 1 transmission per day.  There are no symptom/patient initiated recordings. All of the recordings demonstrate AFib. The heart rate probably averages a little over 100 bpm.  I see 1 pause of 1.4 seconds.  Generally, the minimum rate is around 60 in the strips and the fastest rate is around 150 all the way up to 165 in the strips. It does look like this is chronic AFib (although I certainly cannot exclude that she is in and out on a daily basis). There are no ventricular arrhythmias. It does look like she would benefit from a little bit more rate control.              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  No known etiology at that time.      HLD (hyperlipidemia) 09/09/2010    Essential hypertension 09/07/2010     January, 2012 - lisinopril started         I reviewed and confirmed this patient's problem list, active medications, allergies, and past medical, social, family &  tobacco histories.     Review of Systems  14 point review of systems negative except as above.    Vitals:    08/26/22 0954   O2 Device: None (Room air)   PainSc: Zero   Height: 165.1 cm (5' 5)     Body mass index is 32.62 kg/m?Marland Kitchen     Physical Exam  General Appearance: no acute distress  HEENT: EOMI, mucous membranes moist, oropharynx is clear  Neck Veins: neck veins are flat & not distended  Carotid Arteries: no bruits  Chest Inspection: chest is normal in appearance  Auscultation/Percussion: lungs clear to auscultation, no rales, rhonchi, or wheezing  Cardiac Rhythm: Irregularly irregular.    Cardiac Auscultation: Normal S1 & S2, no S3 or S4, no rub  Murmurs: no cardiac murmurs  Abdominal Exam: soft, non-tender, normal bowel sounds, no masses or bruits  Abdominal aorta: nonpalpable   Liver & Spleen: no organomegaly  Extremities: no lower extremity edema; palpable distal pulses  Skin: warm & intact  Neurologic Exam: oriented to time, place and person; no focal neurologic deficits       Cardiovascular Studies  07/13/19 2D + DOPPLER ECHO   Result Value Ref Range    BSA 2.02 m2    LVIDD 5.2 3.8 - 5.2 cm    IVS 1.1 0.6 - 0.9 cm    PW 1.1 0.6 - 0.9 cm    LVIDS 3.3 2.2 - 3.5 cm    FS 36.54 28 - 44 %    Teichholtz 62.12 %    LA volume 71 22 - 52 mL    Sinus 3.5 2.4 - 3.6 cm    Ascending aorta 3.8 cm    LV mass 221 67 - 162 g    LA size 4.9 2.7 - 3.8 cm    RWT 0.42 <=0.42    AV peak velocity 2.0 m/s    E/A ratio 2.63     TDI lateral e' 0.090 m/s    Lateral E/E' ratio 9.33     Right Ventricular Basal Diameter 4.3 2.5 - 4.1 cm    Right Ventricular Mid Diameter 3.5 1.9 - 3.5 cm    Right Ventricular Wall 14.6 0.1 - 0.5 cm    Right Atrial Area 21.7 <18 cm2    Right Heart Systolic Mmode TAPSE 2.2 >1.7 cm    MV Peak E Vel PW 0.840 m/s    MV Peak A Vel 0.320 m/s    Left Atrium Index 35.15 16 - 34 mL/m2    Cardiology Ultrasound Machine [employer] Epiq     Left Ventricle Mass Index 109 43 - 95 g/m2    Left Ventricle Diastolic Volume 77 46 - 106 mL    Left Ventricle Diastolic Volume Index 38 29 - 61 mL/m2    Left Ventricle Systolic Volume 23 14 - 42 mL    Left Ventricle Systolic Volume Index 11 8 - 24 mL/m2    TDI Medial e' 0.090 m/s    Medial E/E' ratio 9.33     TV rest pulmonary artery pressure 39 mmHg    ECHO EF 55 %    TR PEAK VELOCITY 3 m/s    RV SYSTOLIC PRESSURE 36     RA PRESSURE 3         Cardiovascular Health Factors  Vitals BP Readings from Last 3 Encounters:   04/15/22 128/74   07/30/21 132/82   11/11/20 126/72     Wt Readings from Last 3 Encounters:  04/15/22 88.9 kg (196 lb)   07/30/21 92.6 kg (204 lb 3.2 oz)   11/11/20 89.8 kg (198 lb)     BMI Readings from Last 3 Encounters:   08/26/22 32.62 kg/m?   04/15/22 32.62 kg/m?   07/30/21 33.98 kg/m?      Smoking Social History     Tobacco Use   Smoking Status Former    Current packs/day: 0.00    Types: Cigarettes    Start date: 07/05/1957    Quit date: 07/05/1962    Years since quitting: 60.1   Smokeless Tobacco Never      Lipid Profile Cholesterol   Date Value Ref Range Status   01/20/2022 165  Final     HDL   Date Value Ref Range Status   01/20/2022 60  Final     LDL   Date Value Ref Range Status   01/20/2022 94  Final     Triglycerides   Date Value Ref Range Status   01/20/2022 56  Final      Blood Sugar Hemoglobin A1C   Date Value Ref Range Status   06/30/2017 5.5 4.0 - 6.0 % Final     Comment:     The ADA recommends that most patients with type 1 and type 2 diabetes maintain   an A1c level <7%.       Glucose   Date Value Ref Range Status   01/20/2022 92  Final   01/19/2021 101  Final   01/18/2021 117 (H) 70 - 105 Final        ASCVD Risk Assessment:     ASCVD 10-year risk calculated: The ASCVD Risk score (Arnett DK, et al., 2019) failed to calculate for the following reasons:    The patient has a prior MI or stroke diagnosis     LDL 70-189, if ASCVD 10-y risk is >7.5%, high to moderate-intensity statin therapy is recommended  Diabetes with ASCVD 10-y risk >7.5%, high-intensity statin therapy is recommended.  Diabetes with ASCVD 10-y risk <7.5%, moderate-intensity statin therapy is recommended.      Current Medications (including today's revisions)   acetaminophen (TYLENOL) 500 mg tablet Take one tablet by mouth every 6 hours as needed for Pain. Max of 4,000 mg of acetaminophen in 24 hours.    apixaban (ELIQUIS) 5 mg tab tablet Take 1 Tab by mouth twice daily.    cetirizine (ZYRTEC) 10 mg tablet Take one tablet by mouth every morning.    cholecalciferol (VITAMIN D-3) 5000 unit tablet Take one tablet by mouth daily.    cyanocobalamin (VITAMIN B-12, RUBRAMIN) 1,000 mcg/mL injection Inject 1 mL into the muscle every 30 days.    diclofenac sodium (PENNSAID TP) Apply  topically to affected area as Needed.    dilTIAZem CD (CARDIZEM CD) 180 mg capsule Take 1 capsule by mouth once daily    FOLIC ACID/MULTIVIT-MIN/LUTEIN (CENTRUM SILVER PO) Take 1 tablet by mouth daily.    nitroglycerin (NITROSTAT) 0.4 mg tablet Place 1 tab under tongue every 5 minutes as needed for Chest Pain (Not to exceed 3 doses /15 min. If pain persists , seek medical attention    oxybutynin XL (DITROPAN XL) 5 mg tablet Take one tablet by mouth daily.    pantoprazole DR (PROTONIX) 20 mg tablet Take one tablet by mouth daily. (Patient taking differently: Take two tablets by mouth daily.)    simvastatin (ZOCOR) 20 mg tablet Take one tablet by mouth daily.    vitamin E 400 unit capsule Take  one capsule by mouth daily.         Orpah Cobb MD  Cardiovascular Medicine.

## 2022-08-26 NOTE — Patient Instructions
Thank you for visiting our office today.    We would like to make the following medication adjustments:    Flecainide '50mg'$  twice daily ( Start this AFTER cardioversion)       Otherwise continue the same medications as you have been doing.          We will be pursuing the following tests after your appointment today:       Orders Placed This Encounter    ECG 12-LEAD    flecainide (TAMBOCOR) 50 mg tablet         We will plan to see you back in 3 months.  Please call us in the meantime with any questions or concerns.        Please allow 5-7 business days for our providers to review your results. All normal results will go to MyChart. If you do not have Mychart, it is strongly recommended to get this so you can easily view all your results. If you do not have mychart, we will attempt to call you once with normal lab and testing results. If we cannot reach you by phone with normal results, we will send you a letter.  If you have not heard the results of your testing after one week please give [place] a call.       Your Cardiovascular Medicine [place] Team Richardson Landry, Rene Kocher, Threasa Beards, and [place])  phone number is [phone].              Updated History and Physical Completed?    Your scheduled Procedure:Cardioversion  Procedure Date:08/31/22  Procedure Arrival Time: 7am        You will need to be fasting after midnight on the night prior to the procedure(nothing to eat or drink)  You must have a driver present to take you home from the test  You will receive a call the day or 2 prior to the procedure to further instruct you about medication hold and restrictions.   Call us back with any questions at [phone]

## 2022-08-26 NOTE — Progress Notes
Report Sheet    DCCV           Indication: Permanent Atrial Fibrillation  Ordering Provider: Orpah Cobb    Name: Karen Horton     Age: 82 y.o.    DOB: 02-16-1941     MRN: [id number]  Patient phone number: [phone]    Communication Barriers: None    Lab(s) needed: EKG, K  Other: None    Device check needed: No  Device: No results found for: GENERATOR, EPDEVTYP    Other implanted devices/pumps: None noted  Has Controller (pt to bring): No    Pt Called: 08/26/22 @ 1630  Arrival Time: 0700  for 0800 scheduled cardioversion  Driver Information: Husband and Daughter    Date of Last H&P: 08/26/22 with Laverda Sorenson   Additional Information: Remind pt of 10/13/22 Sleep Study Appointment    Prior TEE: None Noted    Prior DCCV: None noted     Prior Echo:07/2019     Transthoracic echocardiogram 07/2019:  Left ventricular systolic function is within normal limits.  LVEF 55%  Mild right ventricular enlargement with normal RV systolic function.  TAPSE is 2.2 cm.  Moderate to severe left atrial enlargement.   Aortic valve sclerosis without stenosis.  Moderate mitral regurgitation.   No pericardial effusion.   Estimated peak systolic pulmonary artery pressure is 39 mmHg.      Previous TEE/DCCV details: N/A    EF:   ECHO EF   Date Value Ref Range Status   07/13/2019 55 % Final       Anticoag:Eliquis     Dose: 5 mg     Frequency: BID     Missed: Pt stated she did not think so, but questioned about 3 weeks of doses and stated  she did not think so.  Please review further  Labs   INR    HGB    Platelets    Glucose    NA    K    Creatinine    Other      Medications to HOLD day of:  Vitamins/Supplements  None    Probe   Gastric Surgery    GERD   Protonix   Swallow difficulty    Head/Neck Surg/Rad    Chest Surg/Rad    EGD    Varices/Esophag CA    GI bleed    Dental issues      Sedation   COPD    OSA/CPAP    Asthma    Pulm HTN    Anesthesia Issues    Smoker   Past   ETOH & Frequency    Drug Use    Chronic Pain Med    GLP-1 Last dose:     Current Medications:    acetaminophen (TYLENOL) 500 mg tablet Take one tablet by mouth every 6 hours as needed for Pain. Max of 4,000 mg of acetaminophen in 24 hours.    apixaban (ELIQUIS) 5 mg tab tablet Take 1 Tab by mouth twice daily.    cetirizine (ZYRTEC) 10 mg tablet Take one tablet by mouth every morning.    cholecalciferol (VITAMIN D-3) 5000 unit tablet Take one tablet by mouth daily.    diclofenac sodium (PENNSAID TP) Apply  topically to affected area as Needed.    dilTIAZem CD (CARDIZEM CD) 180 mg capsule Take 1 capsule by mouth once daily    flecainide (TAMBOCOR) 50 mg tablet Take one tablet by mouth twice daily.    FLUoxetine (PROZAC) 10 mg  capsule Take one capsule by mouth as Needed.    FOLIC ACID/MULTIVIT-MIN/LUTEIN (CENTRUM SILVER PO) Take 1 tablet by mouth daily.    magnesium 200 mg tablet Take one tablet by mouth twice daily.    nitroglycerin (NITROSTAT) 0.4 mg tablet Place 1 tab under tongue every 5 minutes as needed for Chest Pain (Not to exceed 3 doses /15 min. If pain persists , seek medical attention    oxybutynin XL (DITROPAN XL) 5 mg tablet Take one tablet by mouth daily.    pantoprazole DR (PROTONIX) 40 mg tablet Take one tablet by mouth daily.    simvastatin (ZOCOR) 20 mg tablet Take one tablet by mouth daily.    vitamin E 400 unit capsule Take one capsule by mouth daily.    vitamins, B complex tablet Take one tablet by mouth daily.       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  No known etiology at that time.    HLD (hyperlipidemia) 09/09/2010    HTN (hypertension)     Hypertension 09/07/2010      Surgical History:   Procedure Laterality Date    TUBAL LIGATION  1974    ANGIOGRAPHY CORONARY ARTERY WITH LEFT HEART CATHETERIZATION N/A 07/04/2017    Performed by Marcell Barlow, MD, FACC at [facility] CATH LAB    PERCUTANEOUS CORONARY STENT PLACEMENT WITH ANGIOPLASTY N/A 07/04/2017    Performed by Marcell Barlow, MD, FACC at [facility] CATH LAB    BREAST BIOPSY      FOOT SURGERY      2x-left foot    HX BLADDER SUSPENSION         Allergies:   Allergies   Allergen Reactions    Shellfish Containing Products SHORTNESS OF BREATH, ITCHING and EDEMA     Swelling of neck    Lisinopril COUGH    Other [Unclassified Drug] SEE COMMENTS     Bananas- swelling in the throat and splotchy on the face    Shrimp ITCHING     Pincus Badder, RN

## 2022-08-26 NOTE — Progress Notes
Medicare is listed as patient's primary insurance coverage.  Pre-certification is not required for hospitalizations.

## 2022-08-31 ENCOUNTER — Ambulatory Visit: Admit: 2022-08-31 | Discharge: 2022-08-31 | Payer: MEDICARE

## 2022-08-31 ENCOUNTER — Encounter: Admit: 2022-08-31 | Discharge: 2022-08-31 | Payer: MEDICARE

## 2022-08-31 DIAGNOSIS — I10 Essential (primary) hypertension: Secondary | ICD-10-CM

## 2022-08-31 DIAGNOSIS — E782 Mixed hyperlipidemia: Secondary | ICD-10-CM

## 2022-08-31 DIAGNOSIS — I4821 Permanent atrial fibrillation: Secondary | ICD-10-CM

## 2022-08-31 LAB — POC POTASSIUM: POTASSIUM, POC: 4 MMOL/L — ABNORMAL LOW (ref 3.5–5.1)

## 2022-08-31 MED ORDER — SODIUM CHLORIDE 0.9 % IV SOLP (OR) 500ML
INTRAVENOUS | 0 refills | Status: DC
Start: 2022-08-31 — End: 2022-08-31

## 2022-08-31 MED ORDER — PROPOFOL 10 MG/ML IV EMUL 20 ML (INFUSION)(AM)(OR)
INTRAVENOUS | 0 refills | Status: DC
Start: 2022-08-31 — End: 2022-08-31
  Administered 2022-08-31: 14:00:00 40 mg via INTRAVENOUS

## 2022-08-31 NOTE — Progress Notes
Care Plan   Care Category & Patient Outcome Goal Met Treatment/  Interventions Plan of the Day RN Name   Cardiovascular  Hemodynamic stability and adequate peripheral perfusion.   Yes   Refer to  patient's chart. Monitor VS per sedation standard. Monitor ECG continuously.  Assess/maintain IV patency.   Nedda Gains, RN   Respiratory  Patent airway, ease of respiration, and adequate oxygenation.   Yes   Refer to  patient's chart. Maintain open airway. Assess respirations. Monitor O2 saturations. Titrate O2 to keep sat>= 95% or baseline.   Destin Vinsant, RN   Psychological/Emotional/Spiritual  Cope with procedure with support in place.  Spiritual needs are addressed.     Yes   Refer to  patient's chart. Provide adequate and thorough instructions.  Provide a caring and supportive environment.  Communicate patients concerns with other members of the health team.   Olanda Downie, RN   Pain  Patients pain goal met.   Yes   Refer to  patient's chart. Prepare patient for potentially uncomfortable procedure.  Observe for verbal/nonverbal complaints of pain.  Assess pain.  Provide comfort measures.   Soham Hollett, RN   Safety/Fall Risk  Free from injury, security maintained.   Yes High Risk    Refer to  patient's chart.   Follow nursing standard of practice for high risks fall patients.   Bryann Mcnealy, RN   Knowledge Base  Verbalize understanding of procedure/information provided.   Yes   Refer to  patient's chart. Describe the procedure along with what symptoms to expect.  Evaluate patients understanding of procedure.  Encourage patient to ask questions.  Provide additional information as needed.   Darnel Mchan, RN

## 2022-08-31 NOTE — Progress Notes
Pre-Operative Assessment for TEE or Cardioversion    Date of Service:  08/31/2022    Karen Horton is a 82 y.o. y.o. female. With significant HTN, HLD, CVA, Afib, GERD who is referred for Cardioversion Indication: Afib.      she has been compliant with her  apixaban (Eliquis) Missed dose: No and denies bleeding. she is Positive for: GERD and Positive for: ETOH 1 liquor drinks per day(s).      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  No known etiology at that time.    HLD (hyperlipidemia) 09/09/2010    HTN (hypertension)     Hypertension 09/07/2010        Surgical History:   Surgical History:   Procedure Laterality Date    TUBAL LIGATION  1974    ANGIOGRAPHY CORONARY ARTERY WITH LEFT HEART CATHETERIZATION N/A 07/04/2017    Performed by Marcell Barlow, MD, FACC at [facility] CATH LAB    PERCUTANEOUS CORONARY STENT PLACEMENT WITH ANGIOPLASTY N/A 07/04/2017    Performed by Marcell Barlow, MD, FACC at [facility] CATH LAB    BREAST BIOPSY      FOOT SURGERY      2x-left foot    HX BLADDER SUSPENSION         Social History     Social History     Tobacco Use    Smoking status: Former     Current packs/day: 0.00     Types: Cigarettes     Start date: 07/05/1957     Quit date: 07/05/1962     Years since quitting: 60.1    Smokeless tobacco: Never   Substance Use Topics    Alcohol use: Yes     Alcohol/week: 14.0 standard drinks of alcohol     Types: 14 Shots of liquor per week     Comment: 1-2 drinks nightly    Drug use: No         Allergies                                        Allergies   Allergen Reactions    Shellfish Containing Products SHORTNESS OF BREATH, ITCHING and EDEMA     Swelling of neck    Lisinopril COUGH    Other [Unclassified Drug] SEE COMMENTS     Bananas- swelling in the throat and splotchy on the face    Shrimp ITCHING          Current Medications  Current Outpatient Medications on File Prior to Encounter   Medication Sig Dispense Refill    acetaminophen (TYLENOL) 500 mg tablet Take one tablet by mouth every 6 hours as needed for Pain. Max of 4,000 mg of acetaminophen in 24 hours.      apixaban (ELIQUIS) 5 mg tab tablet Take 1 Tab by mouth twice daily. 180 Tab 4    cetirizine (ZYRTEC) 10 mg tablet Take one tablet by mouth every morning.  cholecalciferol (VITAMIN D-3) 5000 unit tablet Take one tablet by mouth daily.      diclofenac sodium (PENNSAID TP) Apply  topically to affected area as Needed.      dilTIAZem CD (CARDIZEM CD) 180 mg capsule Take 1 capsule by mouth once daily 90 capsule 3    flecainide (TAMBOCOR) 50 mg tablet Take one tablet by mouth twice daily. 60 tablet 1    FLUoxetine (PROZAC) 10 mg capsule Take one capsule by mouth as Needed.      FOLIC ACID/MULTIVIT-MIN/LUTEIN (CENTRUM SILVER PO) Take 1 tablet by mouth daily.      magnesium 200 mg tablet Take one tablet by mouth twice daily.      nitroglycerin (NITROSTAT) 0.4 mg tablet Place 1 tab under tongue every 5 minutes as needed for Chest Pain (Not to exceed 3 doses /15 min. If pain persists , seek medical attention 25 tablet 3    oxybutynin XL (DITROPAN XL) 5 mg tablet Take one tablet by mouth daily.      pantoprazole DR (PROTONIX) 40 mg tablet Take one tablet by mouth daily.      simvastatin (ZOCOR) 20 mg tablet Take one tablet by mouth daily.      vitamin E 400 unit capsule Take one capsule by mouth daily.      vitamins, B complex tablet Take one tablet by mouth daily.       No current facility-administered medications on file prior to encounter.       Vitals  Estimated body mass index is 33.28 kg/m? as calculated from the following:    Height as of this encounter: 165.1 cm (5' 5).    Weight as of this encounter: 90.7 kg (200 lb).       Patient appears alert and oriented: Yes  NPO: for greater than 8 hours  Inpatient IV status:  20G RAC    Diagnostic Tests  White Blood Cells   Date Value Ref Range Status   01/20/2022 5.58  Final     Hemoglobin   Date Value Ref Range Status   01/20/2022 12.7  Final     Hematocrit   Date Value Ref Range Status   01/20/2022 37.7  Final     Platelet Count   Date Value Ref Range Status   01/20/2022 307  Final     Sodium   Date Value Ref Range Status   01/20/2022 140  Final     Potassium   Date Value Ref Range Status   01/20/2022 4.4  Final     Magnesium   Date Value Ref Range Status   07/05/2017 2.2 1.6 - 2.6 mg/dL Final     Blood Urea Nitrogen   Date Value Ref Range Status   01/20/2022 18.0  Final     Creatinine   Date Value Ref Range Status   01/20/2022 0.94  Final     Glucose   Date Value Ref Range Status   01/20/2022 92  Final       Last MAC INR Flow Sheet Entry:    Last recorded Lab results:   INR   Date Value Ref Range Status   12/23/2012 1.0 0.8 - 1.2 Final     APTT   Date Value Ref Range Status   07/04/2017 71.6 (H) 24.0 - 36.5 SEC Final     Comment:     NOTE NEW REFERENCE RANGES           Blood Cultures  Resulted Micro Last 72  Hrs    No results found         Last TEE date: None  Last Cardioversion date: None  Echo procedures within the past 30 days:  No results found.      Device Information on File  No results found for: GENERATOR, EPDEVTYP      Additional Comments:  None    Plan:  Dr. Marta Antu will plan to proceed with the  Cardioversion.

## 2022-08-31 NOTE — Patient Instructions
CARDIOLOGY PROCEDURES           POST SEDATION INSTRUCTIONS      Patient Name: Karen Horton  MRN#: [id number]  Date: 08/31/2022      Please follow the instructions listed below:       Please have someone accompany you, as YOU SHOULD NOT drive or operate machinery for at least 12-24 hours following the procedure.    There may be some residual effects from the sedatives during the procedure.  Do not drive a vehicle for up to 24 hours after receiving sedation.  Do not operate heavy or potentially harmful equipment  Do not make legally binding decisions  Do not drink alcohol for up to 24 hours  Do not communicate through social media for 24 hours.     Other instructions: You may feel a sunburn sensation from the defibrillator pads.  Treat it like a sunburn (aloe vera, lotion, aquaphor)     If you have question or concerns about this procedure, please contact the Cardiology office at [phone], and ask to speak to one of the nurses.      Current Medications List:   acetaminophen (TYLENOL) 500 mg tablet Take one tablet by mouth every 6 hours as needed for Pain. Max of 4,000 mg of acetaminophen in 24 hours.    apixaban (ELIQUIS) 5 mg tab tablet Take 1 Tab by mouth twice daily.    cetirizine (ZYRTEC) 10 mg tablet Take one tablet by mouth every morning.    cholecalciferol (VITAMIN D-3) 5000 unit tablet Take one tablet by mouth daily.    diclofenac sodium (PENNSAID TP) Apply  topically to affected area as Needed.    dilTIAZem CD (CARDIZEM CD) 180 mg capsule Take 1 capsule by mouth once daily    flecainide (TAMBOCOR) 50 mg tablet Take one tablet by mouth twice daily.    FLUoxetine (PROZAC) 10 mg capsule Take one capsule by mouth as Needed.    FOLIC ACID/MULTIVIT-MIN/LUTEIN (CENTRUM SILVER PO) Take 1 tablet by mouth daily.    magnesium 200 mg tablet Take one tablet by mouth twice daily.    nitroglycerin (NITROSTAT) 0.4 mg tablet Place 1 tab under tongue every 5 minutes as needed for Chest Pain (Not to exceed 3 doses /15 min. If pain persists , seek medical attention    oxybutynin XL (DITROPAN XL) 5 mg tablet Take one tablet by mouth daily.    pantoprazole DR (PROTONIX) 40 mg tablet Take one tablet by mouth daily.    simvastatin (ZOCOR) 20 mg tablet Take one tablet by mouth daily.    vitamin E 400 unit capsule Take one capsule by mouth daily.    vitamins, B complex tablet Take one tablet by mouth daily.         Instructions Given To: [name]    Instructions Given By: Bobette Mo, RN

## 2022-08-31 NOTE — Anesthesia Pre-Procedure Evaluation
Anesthesia Pre-Procedure Evaluation    Name: Karen Horton      MRN: [id number]     DOB: September 12, 1940     Age: 82 y.o.     Sex: female   _________________________________________________________________________     Procedure Info:   Procedure Information       Date/Time: 08/31/22 0800    Scheduled providers: Adair Laundry, MD; Baron Sane, RN    Procedure: CARDIOVERSION, EXTERNAL    Location: Cardiovascular Medicine: [facility]            Physical Assessment  Vital Signs (last filed in past 24 hours):  BP: 161/70 (02/27 0730)  Pulse: 62 (02/27 0730)  Respirations: 20 PER MINUTE (02/27 0730)  SpO2: 97 % (02/27 0730)  O2 Device: None (Room air) (02/27 0730)  Height: 165.1 cm (5' 5) (02/27 0729)  Weight: 90.7 kg (200 lb) (02/27 0729)      Patient History   Allergies   Allergen Reactions   ? Shellfish Containing Products SHORTNESS OF BREATH, ITCHING and EDEMA     Swelling of neck   ? Lisinopril COUGH   ? Other [Unclassified Drug] SEE COMMENTS     Bananas- swelling in the throat and splotchy on the face   ? Shrimp ITCHING        Current Medications    Medication Directions   acetaminophen (TYLENOL) 500 mg tablet Take one tablet by mouth every 6 hours as needed for Pain. Max of 4,000 mg of acetaminophen in 24 hours.   apixaban (ELIQUIS) 5 mg tab tablet Take 1 Tab by mouth twice daily.   cetirizine (ZYRTEC) 10 mg tablet Take one tablet by mouth every morning.   cholecalciferol (VITAMIN D-3) 5000 unit tablet Take one tablet by mouth daily.   diclofenac sodium (PENNSAID TP) Apply  topically to affected area as Needed.   dilTIAZem CD (CARDIZEM CD) 180 mg capsule Take 1 capsule by mouth once daily   flecainide (TAMBOCOR) 50 mg tablet Take one tablet by mouth twice daily.   FLUoxetine (PROZAC) 10 mg capsule Take one capsule by mouth as Needed.   FOLIC ACID/MULTIVIT-MIN/LUTEIN (CENTRUM SILVER PO) Take 1 tablet by mouth daily.   magnesium 200 mg tablet Take one tablet by mouth twice daily.   nitroglycerin (NITROSTAT) 0.4 mg tablet Place 1 tab under tongue every 5 minutes as needed for Chest Pain (Not to exceed 3 doses /15 min. If pain persists , seek medical attention   oxybutynin XL (DITROPAN XL) 5 mg tablet Take one tablet by mouth daily.   pantoprazole DR (PROTONIX) 40 mg tablet Take one tablet by mouth daily.   simvastatin (ZOCOR) 20 mg tablet Take one tablet by mouth daily.   vitamin E 400 unit capsule Take one capsule by mouth daily.   vitamins, B complex tablet Take one tablet by mouth daily.       Review of Systems/Medical History        PONV Screening: Non-smoker and Female sex    No history of anesthetic complications    No family history of anesthetic complications      Airway - negative        Pulmonary           No indications/hx of asthma      no COPD         No recent URI        No Obstructive Sleep Apnea      Cardiovascular  Exercise tolerance: >4 METS      Beta Blocker therapy: No      Beta blockers within 24 hours: No      Hypertension                  Dysrhythmias      Hyperlipidemia      GI/Hepatic/Renal             GERD, well controlled        No liver disease:         No renal disease:           Neuro/Psych           CVA, > 9 months      No indications/hx of neuropathy      Musculoskeletal - negative          Endocrine/Other       No diabetes        No hypothyroidism      No hyperthyroidism        Constitution - negative     Physical Exam    Airway Findings      Mallampati: II      TM distance: >3 FB      Neck ROM: full      Mouth opening: good      Airway patency: adequate    Dental Findings: Negative            Cardiovascular Findings:       Rhythm: irregular      Rate: abnormal      No murmur    Pulmonary Findings:       Breath sounds clear to auscultation.    Abdominal Findings:         Abdomen soft    Neurological Findings:       Alert and oriented x 3    Constitutional findings:       No acute distress      Well-developed      Well-nourished       Previous Airway Procedure Notes Displaying the 3 most recent records   No records found.         Patient Lines/Drains/Airways Status       Active Lines:       Name Placement date Placement time Site Days    Peripheral IV 12/22/12 Right Anterior Hand 12/22/12  --  Hand  3539                  Diagnostic Tests  Hematology:   Lab Results   Component Value Date    HGB 12.7 01/20/2022    HCT 37.7 01/20/2022    PLTCT 307 01/20/2022    WBC 5.58 01/20/2022    NEUT 62 07/03/2017    ANC 3.10 07/03/2017    ALC 1.00 07/03/2017    MONA 12 07/03/2017    AMC 0.60 07/03/2017    EOSA 5 07/03/2017    ABC 0.00 07/03/2017    MCV 92.0 01/20/2022    MCH 31.0 01/20/2022    MCHC 33.7 01/20/2022    MPV 9.4 01/20/2022    RDW 44.1 01/20/2022         General Chemistry:   Lab Results   Component Value Date    NA 140 01/20/2022    K 4.4 01/20/2022    CL 105 01/20/2022    CO2 26.0 01/20/2022    GAP 13 01/19/2021    BUN 18.0 01/20/2022  CR 0.94 01/20/2022    GLU 92 01/20/2022    CA 9.4 01/20/2022    ALBUMIN 3.9 01/20/2022    MG 2.2 07/05/2017    TOTBILI 0.45 01/20/2022    PO4 3.2 12/23/2012      Coagulation:   Lab Results   Component Value Date    PTT 71.6 07/04/2017    INR 1.0 12/23/2012       PAC Plan    Anesthesia Plan    ASA score: 3   Plan: MAC  Induction method: intravenous  NPO status: acceptable      Informed Consent  Use of blood products discussed with patient      Plan discussed with: anesthesiologist, CRNA and surgeon/proceduralist.      Alerts

## 2022-08-31 NOTE — Anesthesia Post-Procedure Evaluation
Post-Anesthesia Evaluation    Name: Karen Horton      MRN: [id number]     DOB: 01/26/1941     Age: 82 y.o.     Sex: female   __________________________________________________________________________     Procedure Information       Anesthesia Start Date/Time: 08/31/22 0802    Scheduled providers: Melody Comas, MD; Wilhelmenia Blase, RN; Clois Comber, MD    Procedure: CARDIOVERSION, EXTERNAL    Location: [facility]: [facility]  BP: 149/71 (02/27 0825)  Pulse: 43 (02/27 0825)  Respirations: 19 PER MINUTE (02/27 0825)  SpO2: 96 % (02/27 0825)  O2 Device: None (Room air) (02/27 0825)   Vitals Value Taken Time   BP 149/71 08/31/22 0825   Temp     Pulse 43 08/31/22 0825   Respirations 19 PER MINUTE 08/31/22 0825   SpO2 96 % 08/31/22 0825   O2 Device None (Room air) 08/31/22 0825   ABP     ART BP           Post Anesthesia Evaluation Note    Evaluation location: Pre/Post  Patient participation: recovered; patient participated in evaluation  Level of consciousness: alert    Pain score: 0  Pain management: adequate    Hydration: normovolemia  Temperature: 36.0C - 38.4C  Airway patency: adequate    Perioperative Events       Post-op nausea and vomiting: no PONV    Postoperative Status  Cardiovascular status: hemodynamically stable  Respiratory status: spontaneous ventilation  Follow-up needed: none        Perioperative Events  There were no known complications for this encounter.

## 2022-09-03 ENCOUNTER — Ambulatory Visit: Admit: 2022-09-03 | Discharge: 2022-09-03 | Payer: MEDICARE

## 2022-09-03 ENCOUNTER — Encounter: Admit: 2022-09-03 | Discharge: 2022-09-03 | Payer: MEDICARE

## 2022-09-03 DIAGNOSIS — Z8673 Personal history of transient ischemic attack (TIA), and cerebral infarction without residual deficits: Secondary | ICD-10-CM

## 2022-09-03 DIAGNOSIS — I4821 Permanent atrial fibrillation: Secondary | ICD-10-CM

## 2022-09-03 DIAGNOSIS — I10 Essential (primary) hypertension: Secondary | ICD-10-CM

## 2022-09-03 DIAGNOSIS — R072 Precordial pain: Secondary | ICD-10-CM

## 2022-09-03 NOTE — Telephone Encounter
-----   Message from Lurline Del, MD sent at 09/03/2022  3:13 PM CST -----  Thanks for letting me know. Unfortunately she is back in A fib. I would like her to get a 2 week Zio and a referral to EP clinic if she is willing. I would continue the flecainide.     Thanks  [name]  ----- Message -----  From: [name]  Sent: 09/03/2022   2:31 PM CST  To: Lurline Del, MD    Patient is here for ekg after recent cardioversion.  States feeling much better ekg appears to be afib controlled rate in 50s

## 2022-09-03 NOTE — Progress Notes
To our valued patient,     We have enrolled your heart monitor and requested it be sent to your home.  You should receive this within 2-3 business days. Please wear the monitor for 14 days. When you have completed the study, please remove the device, and mail it back to the company. Please call [employer] Customer Service at [phone] with questions about placement, troubleshooting, and insurance coverage. You can reach the [facility] ambulatory heart monitor team at [phone].      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)  Secondary Monitor Diagnosis: Hypertension (I10)  Ordering Provider: Orpah Cobb  AMB Monitor Serial Number: home  No data recorded    Start Time and Date: 09/03/22 4:34 PM   Patient Name: Karen Horton  DOB: 22-Oct-1940 01/15/43  MRN: [id number]  Sex: female  Mobile Phone Number: [phone] (mobile)  Home Phone Number: [phone]  Patient Address: [address] [place] [address]  Insurance Coverage: MEDICARE PART A AND B  Insurance ID: [id number]  Insurance Group #:   Insurance Subscriber: [name],[name]  Implanted Cardiac Device Information: No results found for: EPDEVTYP      Patient instructed to contact company phone number on the monitor box with questions regarding billing, placement, troubleshooting.     [name]    ____________________________________________________________    Clinic Staff:    Complete additional steps for documentation double check/Co-Sign.  In Follow-up, send chart upon closing encounter to P [facility] HRM AMBULATORY MONITORS    [facility] Ambulatory Monitoring Team:  Schedule on appropriate template and check-in.   Clinic Placement Schedule on clinic location [facility] schedule   Home Enrollment Schedule on Home Enrollment schedule ([facility])   Given to patient in clinic for self-placement Schedule on Home Enrollment schedule ([facility])   Inpatient Schedule on [facility] AMBULATORY MONITORING template   2. Please enroll with appropriate vendor.

## 2022-09-03 NOTE — Progress Notes
Patient is here for ekg after recent cardioversion.  States feeling much better ekg appears to be afib controlled rate in 50s

## 2022-09-03 NOTE — Telephone Encounter
-----   Message from Lurline Del, MD sent at 09/03/2022  3:13 PM CST -----  Thanks for letting me know. Unfortunately she is back in A fib. I would like her to get a 2 week Zio and a referral to EP clinic if she is willing. I would continue the flecainide.     Thanks  [name]  ----- Message -----  From: Karen Horton  Sent: 09/03/2022   2:31 PM CST  To: Lurline Del, MD    Patient is here for ekg after recent cardioversion.  States feeling much better ekg appears to be afib controlled rate in 50s

## 2022-09-03 NOTE — Telephone Encounter
Results and recommendations called to patient

## 2022-09-29 ENCOUNTER — Encounter: Admit: 2022-09-29 | Discharge: 2022-09-29 | Payer: MEDICARE

## 2022-10-11 ENCOUNTER — Encounter: Admit: 2022-10-11 | Discharge: 2022-10-11 | Payer: MEDICARE

## 2022-10-11 NOTE — Telephone Encounter
-----   Message from Loleta Chance, [place] sent at 10/11/2022 10:11 AM CDT -----    ----- Message -----  From: Orpah Cobb, MD  Sent: 10/11/2022  10:10 AM CDT  To: Weston Brass; [facility] Nurse Gen Card Team Gold    Please notify patient that her results show that she is back in A fib. I recommend she stop flecainide. I would recommend she start metoprolol succinate 25 mg daily and be referred to EP clinic.     Thanks  [employer]

## 2022-10-11 NOTE — Telephone Encounter
Left VM requesting callback to discuss results and recommendations.

## 2022-10-13 ENCOUNTER — Encounter: Admit: 2022-10-13 | Discharge: 2022-10-13 | Payer: MEDICARE

## 2022-10-17 ENCOUNTER — Encounter: Admit: 2022-10-17 | Discharge: 2022-10-17 | Payer: MEDICARE

## 2022-10-17 MED ORDER — FLECAINIDE 50 MG PO TAB
50 mg | ORAL_TABLET | Freq: Two times a day (BID) | ORAL | 0 refills
Start: 2022-10-17 — End: ?

## 2022-10-23 ENCOUNTER — Encounter: Admit: 2022-10-23 | Discharge: 2022-10-23 | Payer: MEDICARE

## 2022-10-23 MED ORDER — FLECAINIDE 50 MG PO TAB
50 mg | ORAL_TABLET | Freq: Two times a day (BID) | ORAL | 0 refills
Start: 2022-10-23 — End: ?

## 2022-12-02 ENCOUNTER — Encounter: Admit: 2022-12-02 | Discharge: 2022-12-02 | Payer: MEDICARE

## 2022-12-02 DIAGNOSIS — R072 Precordial pain: Secondary | ICD-10-CM

## 2022-12-02 DIAGNOSIS — I10 Essential (primary) hypertension: Secondary | ICD-10-CM

## 2022-12-02 DIAGNOSIS — E782 Mixed hyperlipidemia: Secondary | ICD-10-CM

## 2022-12-02 DIAGNOSIS — R0683 Snoring: Secondary | ICD-10-CM

## 2022-12-02 DIAGNOSIS — E785 Hyperlipidemia, unspecified: Secondary | ICD-10-CM

## 2022-12-02 DIAGNOSIS — R0989 Other specified symptoms and signs involving the circulatory and respiratory systems: Secondary | ICD-10-CM

## 2022-12-02 DIAGNOSIS — Z8673 Personal history of transient ischemic attack (TIA), and cerebral infarction without residual deficits: Secondary | ICD-10-CM

## 2022-12-02 DIAGNOSIS — G4733 Obstructive sleep apnea (adult) (pediatric): Secondary | ICD-10-CM

## 2022-12-02 DIAGNOSIS — I4821 Permanent atrial fibrillation: Secondary | ICD-10-CM

## 2022-12-02 LAB — LIPID PROFILE
CHOLESTEROL/HDL %: 3
CHOLESTEROL: 155
HDL: 61
LDL: 81
TRIGLYCERIDES: 66
VLDL: 13

## 2022-12-02 MED ORDER — DILTIAZEM HCL 120 MG PO CP24
120 mg | ORAL_CAPSULE | Freq: Every day | ORAL | 3 refills | 90.00000 days | Status: AC
Start: 2022-12-02 — End: ?

## 2022-12-02 NOTE — Patient Instructions
Decrease diltiazem to 120 mg    Follow up as directed.  Call sooner if issues.  Call the [place] nursing line at [phone].  Leave a detailed message for the nurse in [place] [facility] with how we can assist you and we will call you back.

## 2022-12-27 ENCOUNTER — Encounter: Admit: 2022-12-27 | Discharge: 2022-12-27 | Payer: MEDICARE

## 2022-12-28 ENCOUNTER — Encounter: Admit: 2022-12-28 | Discharge: 2022-12-28 | Payer: MEDICARE

## 2023-03-13 ENCOUNTER — Encounter: Admit: 2023-03-13 | Discharge: 2023-03-13 | Payer: MEDICARE

## 2023-06-08 ENCOUNTER — Encounter: Admit: 2023-06-08 | Discharge: 2023-06-08 | Payer: MEDICARE

## 2023-06-09 ENCOUNTER — Encounter: Admit: 2023-06-09 | Discharge: 2023-06-09 | Payer: MEDICARE

## 2023-06-09 ENCOUNTER — Ambulatory Visit: Admit: 2023-06-09 | Discharge: 2023-06-09 | Payer: MEDICARE

## 2023-06-09 DIAGNOSIS — R0989 Other specified symptoms and signs involving the circulatory and respiratory systems: Secondary | ICD-10-CM

## 2023-06-09 DIAGNOSIS — I10 Essential (primary) hypertension: Secondary | ICD-10-CM

## 2023-06-09 DIAGNOSIS — E782 Mixed hyperlipidemia: Secondary | ICD-10-CM

## 2023-06-09 DIAGNOSIS — I4821 Permanent atrial fibrillation: Secondary | ICD-10-CM

## 2023-06-09 NOTE — Progress Notes
Cardiovascular Medicine       Date of Service: 06/09/2023      HPI     Karen Horton is a 82 y.o. female who was seen today in the Cardiovascular Medicine Clinic at [facility] at our [place] office.          She has a past medical history of atrial fibrillation s/p DCCV in 08/2022. She presents today for follow up.      She underwent DCCV in 08/2022 and converted to NSR. At follow up in 09/2022, she was back in A fib and event monitor showed atrial fibrillation with slow ventricular response. Given this, flecainide was discontinued.  The short time that she was in sinus rhythm, she states that she was feeling much better. Given this, referral to EP was placed but she canceled her clinic visit with Dr. Wallene Huh in June due to illness.  She now reports that she is feeling well so she would prefer not to see EP at this time.    She previously saw Dr. Arna Medici for management of permanent atrial fibrillation.  In 2023, she reported some chest pressure and a stress test was ordered.  She underwent a treadmill MPI that showed no significant ischemia.             Transthoracic echocardiogram 07/2019:  Left ventricular systolic function is within normal limits.  LVEF 55%  Mild right ventricular enlargement with normal RV systolic function.  TAPSE is 2.2 cm.  Moderate to severe left atrial enlargement.   Aortic valve sclerosis without stenosis.  Moderate mitral regurgitation.   No pericardial effusion.   Estimated peak systolic pulmonary artery pressure is 39 mmHg.     Coronary angiogram 2018.  IMPRESSION:    Normal coronary anatomy.  No obstructive disease.  Normal left ventricular end-diastolic pressure.     Bruce treadmill MPI 10/23.   This study is probably normal but intermediate risk based on the [facility] treadmill score.  In light of the absence of corresponding regional wall motion or thickening abnormalities, the mild fixed reduction in tracer activity in the distal anterior wall is probably attributable to soft tissue attenuation artifact.  There does not appear to be convincing evidence of inducible ischemia or definite evidence of infarction. Left ventricular systolic function is normal with normal thickening of all myocardial segments. There are no high risk prognostic indicators present.  The exercise ECG is nondiagnostic for ischemia.  The patient demonstrated poor exercise capacity with a normal heart rate and blood pressure response to exercise.            Long term cardiac monitor 09/2022     ZIO XT provides 13 days 21 hours of data     The rhythm is A-fib at 32-112 bpm with a mean of 57 bpm.     ZIO defines pauses as 3 seconds.  Longest R-R interval is 2.8 seconds.  There are multiple RR intervals over 2 seconds.     There are 58 isolated PVCs and 2 couplets.  There is a 12 beat run that the monitoring service is calling nonsustained VT but  it is actually rate related aberrancy at 89-112 bpm and an average rate of 101 bpm     There is 7 diary entries and 2 patient triggers.  Diary 1: Cardio.  A-fib 50-70 bpm  Diary 2: Fluttering/racing short of breath doing laundry.  A-fib 53-72 bpm  Diary 3: Cardio.  A-fib 46-60 bpm  Diary  4: Cardio.  A-fib 50-70 bpm  Diary 5: Short of breath doing laundry.  A-fib 50-62 bpm  Nothing significant on any of these patient activated strips.     Assessment: A-fib with a relatively slow ventricular response.               ECG: Atrial fibrillation with slow ventricular response.          Assessment & Plan   82 y.o. female patient with the following medical problems:    Permanent atrial fibrillation.  On long-term anticoagulation.  Chest pain.  Fatigue.     Feeling well.  She prefers to defer EP referral at this time given that despite atrial fibrillation, she is asymptomatic.  Rates are well-controlled with diltiazem, continue 120 mg daily.  Continue apixaban for thromboembolic stroke prevention.  Stress test did not show any significant ischemia.  Flecainide was discontinued due to recurrence of atrial fibrillation.    Diet and risk factor modification counseling provided.    Return to clinic in 1 year.       Past Medical History  Patient Active Problem List    Diagnosis Date Noted    Gastroesophageal reflux disease with esophagitis 08/02/2017     06/2017 - Protonix started for reflux symptoms.    08/2017 - GI consult planned--Dr. Salvadore Farber in [place]      Precordial pain 08/02/2017     07/04/2017: CARDIAC CATHETERIZATION REPORT:Normal coronary anatomy.No obstructive disease.Normal left ventricular end-diastolic pressure.  03/16/17 - Procedure: ADAC MULTI GATED SESTAMIBI REGADENOSON MPI STRESS TEST: Left Ventricular Ejection Fraction (post stress, in the resting state) =  70 %. Left Ventricular End Diastolic Volume: 80 mL.This study is probably normal.  There is evidence of soft tissue attenuation, a significant ischemic change was not appreciated.  Left ventricular systolic function is normal, the ejection fraction was 70%. There are no high risk prognostic indicators present.  The pulmonary to myocardial count ratio is normal at 0.40, there is no transient ischemic dilatation noted.  The ECG portion of the study is negative for ischemia.The prior study was obtained on 04/24/2014.  It demonstrated an ejection fraction of 63%, the end-diastolic volume was 74 mL, the pulmonary myocardial count ratio 0.37, there is no transient ischemic dilatation noted.  The study also demonstrated soft tissue attenuation, most prominently involving the apex.  A definite ischemic change is not appreciated.In comparing the two studies qualitatively, the perfusion pattern appears quite similar, suggestive of no significant interval change.In aggregate the current study is low risk in regards to predicted annual cardiovascular mortality rate.              Permanent atrial fibrillation (HCC) 04/17/2014     2014 - Stroke--no known AF at that time  2015 - Presented with asymptomatic AF.  Eliquis started.  Chronic, permanent AF.  2016 - Diltiazem started for rate control  07/13/19 -  2D + DOPPLER ECHO: Left ventricular systolic function is within normal limits.LVEF 55%.Mild right ventricular enlargement with normal RV systolic function.  TAPSE is 2.2 cm.Moderate to severe left atrial enlargement. Aortic valve sclerosis without stenosis.Moderate mitral regurgitation. No pericardial effusion. Estimated peak systolic pulmonary artery pressure is 39 mmHg  10/29/13 -  EVENT MONITOR:An event recorder was utilized from March 23 through April 23, a total of 31 days. There are 47 transmissions, 1 baseline, and the rest are automatic.  There is at least 1 transmission per day.  There are no symptom/patient initiated recordings. All of the recordings demonstrate AFib.  The heart rate probably averages a little over 100 bpm.  I see 1 pause of 1.4 seconds.  Generally, the minimum rate is around 60 in the strips and the fastest rate is around 150 all the way up to 165 in the strips. It does look like this is chronic AFib (although I certainly cannot exclude that she is in and out on a daily basis). There are no ventricular arrhythmias. It does look like she would benefit from a little bit more rate control.              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  No known etiology at that time.      HLD (hyperlipidemia) 09/09/2010    Essential hypertension 09/07/2010     January, 2012 - lisinopril started         I reviewed and confirmed this patient's problem list, active medications, allergies, and past medical, social, family & tobacco histories.     Review of Systems  ROS  14 point review of systems negative except as above.    Vitals:    06/09/23 1446   BP: (!) 160/90   BP Source: Arm, Left Upper   Pulse: 53   SpO2: 98%   O2 Device: None (Room air)   PainSc: Zero   Weight: 94.2 kg (207 lb 9.6 oz)   Height: 167 cm (5' 5.75)     Body mass index is 33.76 kg/m?Marland Kitchen Physical Exam  General Appearance: no acute distress  HEENT: EOMI, mucous membranes moist, oropharynx is clear  Neck Veins: neck veins are flat & not distended  Carotid Arteries: no bruits  Chest Inspection: chest is normal in appearance  Auscultation/Percussion: lungs clear to auscultation, no rales, rhonchi, or wheezing  Cardiac Rhythm: Irregularly irregular.  Cardiac Auscultation: Normal S1 & S2, no S3 or S4, no rub  Murmurs: no cardiac murmurs  Abdominal Exam: soft, non-tender, normal bowel sounds, no masses or bruits  Abdominal aorta: nonpalpable   Liver & Spleen: no organomegaly  Extremities: no lower extremity edema; palpable distal pulses  Skin: warm & intact  Neurologic Exam: oriented to time, place and person; no focal neurologic deficits       Cardiovascular Studies  07/13/19   2D + DOPPLER ECHO   Result Value Ref Range    BSA 2.02 m2    LVIDD 5.2 3.8 - 5.2 cm    IVS 1.1 0.6 - 0.9 cm    PW 1.1 0.6 - 0.9 cm    LVIDS 3.3 2.2 - 3.5 cm    FS 36.54 28 - 44 %    Teichholtz 62.12 %    LA volume 71 22 - 52 mL    Sinus 3.5 2.4 - 3.6 cm    Ascending aorta 3.8 cm    LV mass 221 67 - 162 g    LA size 4.9 2.7 - 3.8 cm    RWT 0.42 <=0.42    AV peak velocity 2.0 m/s    E/A ratio 2.63     TDI lateral e' 0.090 m/s    Lateral E/E' ratio 9.33     Right Ventricular Basal Diameter 4.3 2.5 - 4.1 cm    Right Ventricular Mid Diameter 3.5 1.9 - 3.5 cm    Right Ventricular Wall 14.6 0.1 - 0.5 cm    Right Atrial Area 21.7 <18 cm2    Right Heart Systolic Mmode TAPSE 2.2 >1.7 cm  MV Peak E Vel PW 0.840 m/s    MV Peak A Vel 0.320 m/s    Left Atrium Index 35.15 16 - 34 mL/m2    Cardiology Ultrasound Machine [employer] Epiq     Left Ventricle Mass Index 109 43 - 95 g/m2    Left Ventricle Diastolic Volume 77 46 - 106 mL    Left Ventricle Diastolic Volume Index 38 29 - 61 mL/m2    Left Ventricle Systolic Volume 23 14 - 42 mL    Left Ventricle Systolic Volume Index 11 8 - 24 mL/m2    TDI Medial e' 0.090 m/s    Medial E/E' ratio 9.33 TV rest pulmonary artery pressure 39 mmHg    ECHO EF 55 %    TR PEAK VELOCITY 3 m/s    RV SYSTOLIC PRESSURE 36     RA PRESSURE 3         Cardiovascular Health Factors  Vitals BP Readings from Last 3 Encounters:   06/09/23 (!) 160/90   12/02/22 129/75   08/31/22 (!) 145/70     Wt Readings from Last 3 Encounters:   06/09/23 94.2 kg (207 lb 9.6 oz)   12/02/22 91.1 kg (200 lb 12.8 oz)   08/31/22 90.7 kg (200 lb)     BMI Readings from Last 3 Encounters:   06/09/23 33.76 kg/m?   12/02/22 32.66 kg/m?   08/31/22 33.28 kg/m?      Smoking Social History     Tobacco Use   Smoking Status Former    Current packs/day: 0.00    Types: Cigarettes    Start date: 07/05/1957    Quit date: 07/05/1962    Years since quitting: 60.9   Smokeless Tobacco Never      Lipid Profile Cholesterol   Date Value Ref Range Status   03/01/2023 164  Final     HDL   Date Value Ref Range Status   03/01/2023 58  Final     LDL   Date Value Ref Range Status   03/01/2023 89  Final     Triglycerides   Date Value Ref Range Status   03/01/2023 85  Final      Blood Sugar Hemoglobin A1C   Date Value Ref Range Status   06/30/2017 5.5 4.0 - 6.0 % Final     Comment:     The ADA recommends that most patients with type 1 and type 2 diabetes maintain   an A1c level <7%.       Glucose   Date Value Ref Range Status   03/01/2023 96  Final   01/20/2022 92  Final   01/19/2021 101  Final        ASCVD Risk Assessment:     ASCVD 10-year risk calculated: The ASCVD Risk score (Arnett DK, et al., 2019) failed to calculate for the following reasons:    The 2019 ASCVD risk score is only valid for ages 22 to 56    Risk score cannot be calculated because patient has a medical history suggesting prior/existing ASCVD     LDL 70-189, if ASCVD 10-y risk is >7.5%, high to moderate-intensity statin therapy is recommended  Diabetes with ASCVD 10-y risk >7.5%, high-intensity statin therapy is recommended.  Diabetes with ASCVD 10-y risk <7.5%, moderate-intensity statin therapy is recommended.      Current Medications (including today's revisions)   acetaminophen (TYLENOL) 500 mg tablet Take one tablet by mouth every 6 hours as needed for Pain. Max of 4,000 mg  of acetaminophen in 24 hours.    apixaban (ELIQUIS) 5 mg tab tablet Take 1 Tab by mouth twice daily.    cetirizine (ZYRTEC) 10 mg tablet Take one tablet by mouth every morning.    cholecalciferol (VITAMIN D-3) 5000 unit tablet Take one tablet by mouth daily.    dilTIAZem CD (CARDIZEM CD) 120 mg capsule Take one capsule by mouth daily.    flecainide (TAMBOCOR) 50 mg tablet Take 1 tablet by mouth twice daily    FLUoxetine (PROZAC) 10 mg capsule Take one capsule by mouth as Needed.    FOLIC ACID/MULTIVIT-MIN/LUTEIN (CENTRUM SILVER PO) Take 1 tablet by mouth daily.    magnesium 200 mg tablet Take one tablet by mouth twice daily.    nitroglycerin (NITROSTAT) 0.4 mg tablet Place 1 tab under tongue every 5 minutes as needed for Chest Pain (Not to exceed 3 doses /15 min. If pain persists , seek medical attention    oxybutynin XL (DITROPAN XL) 5 mg tablet Take one tablet by mouth daily.    pantoprazole DR (PROTONIX) 40 mg tablet Take one tablet by mouth daily.    simvastatin (ZOCOR) 20 mg tablet Take one tablet by mouth daily.    vitamin E 400 unit capsule Take one capsule by mouth daily.    vitamins, B complex tablet Take one tablet by mouth daily.         Orpah Cobb MD  Cardiovascular Medicine.

## 2023-11-20 ENCOUNTER — Encounter: Admit: 2023-11-20 | Discharge: 2023-11-20 | Payer: MEDICARE

## 2023-11-21 ENCOUNTER — Encounter: Admit: 2023-11-21 | Discharge: 2023-11-21 | Payer: MEDICARE

## 2024-07-09 ENCOUNTER — Encounter: Admit: 2024-07-09 | Discharge: 2024-07-09 | Payer: MEDICARE

## 2024-07-17 ENCOUNTER — Encounter: Admit: 2024-07-17 | Discharge: 2024-07-17 | Payer: MEDICARE | Primary: General Practice

## 2024-07-19 ENCOUNTER — Encounter: Admit: 2024-07-19 | Discharge: 2024-07-19 | Payer: MEDICARE | Primary: General Practice

## 2024-07-19 DIAGNOSIS — I4821 Permanent atrial fibrillation: Secondary | ICD-10-CM

## 2024-07-19 DIAGNOSIS — I10 Essential (primary) hypertension: Secondary | ICD-10-CM

## 2024-07-19 DIAGNOSIS — R0989 Other specified symptoms and signs involving the circulatory and respiratory systems: Principal | ICD-10-CM

## 2024-07-19 DIAGNOSIS — E782 Mixed hyperlipidemia: Secondary | ICD-10-CM

## 2024-07-19 LAB — COMPREHENSIVE METABOLIC PANEL
ALBUMIN: 3.8
ALK PHOSPHATASE: 90
ALT: 14
ANION GAP: 11
AST: 21
BLD UREA NITROGEN: 18
CALCIUM: 9.3
CHLORIDE: 104
CO2: 27
CREATININE: 1
GLUCOSE,PANEL: 97
POTASSIUM: 4.1
TOTAL BILIRUBIN: 0.6
TOTAL PROTEIN: 6.9

## 2024-07-19 LAB — LIPID PROFILE
CHOLESTEROL: 134
TRIGLYCERIDES: 107

## 2024-07-19 LAB — CBC
HEMATOCRIT: 38
HEMOGLOBIN: 12
MCH: 31
MCHC: 32
MCV: 96 — ABNORMAL HIGH (ref 79.4–94.8)
MPV: 9.3 — ABNORMAL LOW (ref 9.4–12.4)
PLATELET COUNT: 313
RBC COUNT: 3.9
RDW: 13
WBC COUNT: 5.3
